# Patient Record
Sex: Male | Born: 1950 | Race: Black or African American | Hispanic: No | Marital: Married
Health system: Southern US, Community
[De-identification: ages and names within clinical notes are randomized; demographics above are authoritative.]

---

## 2020-07-03 ENCOUNTER — Other Ambulatory Visit: Payer: Self-pay

## 2020-07-03 ENCOUNTER — Inpatient Hospital Stay (HOSPITAL_COMMUNITY)
Admission: EM | Admit: 2020-07-03 | Discharge: 2020-08-07 | DRG: 870 | Disposition: E | Payer: No Typology Code available for payment source | Attending: Pulmonary Disease | Admitting: Pulmonary Disease

## 2020-07-03 ENCOUNTER — Emergency Department (HOSPITAL_COMMUNITY): Payer: No Typology Code available for payment source

## 2020-07-03 ENCOUNTER — Inpatient Hospital Stay (HOSPITAL_COMMUNITY): Payer: No Typology Code available for payment source

## 2020-07-03 ENCOUNTER — Encounter (HOSPITAL_COMMUNITY): Payer: Self-pay | Admitting: Emergency Medicine

## 2020-07-03 DIAGNOSIS — E872 Acidosis: Secondary | ICD-10-CM | POA: Diagnosis not present

## 2020-07-03 DIAGNOSIS — Z01818 Encounter for other preprocedural examination: Secondary | ICD-10-CM

## 2020-07-03 DIAGNOSIS — Z515 Encounter for palliative care: Secondary | ICD-10-CM | POA: Diagnosis not present

## 2020-07-03 DIAGNOSIS — I82442 Acute embolism and thrombosis of left tibial vein: Secondary | ICD-10-CM | POA: Diagnosis present

## 2020-07-03 DIAGNOSIS — R6520 Severe sepsis without septic shock: Secondary | ICD-10-CM | POA: Diagnosis not present

## 2020-07-03 DIAGNOSIS — D696 Thrombocytopenia, unspecified: Secondary | ICD-10-CM | POA: Diagnosis present

## 2020-07-03 DIAGNOSIS — I129 Hypertensive chronic kidney disease with stage 1 through stage 4 chronic kidney disease, or unspecified chronic kidney disease: Secondary | ICD-10-CM | POA: Diagnosis present

## 2020-07-03 DIAGNOSIS — N2581 Secondary hyperparathyroidism of renal origin: Secondary | ICD-10-CM | POA: Diagnosis present

## 2020-07-03 DIAGNOSIS — R6521 Severe sepsis with septic shock: Secondary | ICD-10-CM | POA: Diagnosis present

## 2020-07-03 DIAGNOSIS — J80 Acute respiratory distress syndrome: Secondary | ICD-10-CM | POA: Diagnosis not present

## 2020-07-03 DIAGNOSIS — E8770 Fluid overload, unspecified: Secondary | ICD-10-CM | POA: Diagnosis not present

## 2020-07-03 DIAGNOSIS — N179 Acute kidney failure, unspecified: Secondary | ICD-10-CM | POA: Diagnosis not present

## 2020-07-03 DIAGNOSIS — E1122 Type 2 diabetes mellitus with diabetic chronic kidney disease: Secondary | ICD-10-CM | POA: Diagnosis present

## 2020-07-03 DIAGNOSIS — E669 Obesity, unspecified: Secondary | ICD-10-CM | POA: Diagnosis present

## 2020-07-03 DIAGNOSIS — R0602 Shortness of breath: Secondary | ICD-10-CM

## 2020-07-03 DIAGNOSIS — G9340 Encephalopathy, unspecified: Secondary | ICD-10-CM | POA: Diagnosis not present

## 2020-07-03 DIAGNOSIS — I482 Chronic atrial fibrillation, unspecified: Secondary | ICD-10-CM | POA: Diagnosis not present

## 2020-07-03 DIAGNOSIS — E8809 Other disorders of plasma-protein metabolism, not elsewhere classified: Secondary | ICD-10-CM | POA: Diagnosis present

## 2020-07-03 DIAGNOSIS — N289 Disorder of kidney and ureter, unspecified: Secondary | ICD-10-CM | POA: Diagnosis not present

## 2020-07-03 DIAGNOSIS — J9611 Chronic respiratory failure with hypoxia: Secondary | ICD-10-CM

## 2020-07-03 DIAGNOSIS — A4189 Other specified sepsis: Secondary | ICD-10-CM | POA: Diagnosis present

## 2020-07-03 DIAGNOSIS — N183 Chronic kidney disease, stage 3 unspecified: Secondary | ICD-10-CM | POA: Diagnosis present

## 2020-07-03 DIAGNOSIS — R0902 Hypoxemia: Secondary | ICD-10-CM | POA: Diagnosis not present

## 2020-07-03 DIAGNOSIS — A419 Sepsis, unspecified organism: Secondary | ICD-10-CM | POA: Diagnosis present

## 2020-07-03 DIAGNOSIS — I4891 Unspecified atrial fibrillation: Secondary | ICD-10-CM | POA: Diagnosis present

## 2020-07-03 DIAGNOSIS — J069 Acute upper respiratory infection, unspecified: Secondary | ICD-10-CM

## 2020-07-03 DIAGNOSIS — R4182 Altered mental status, unspecified: Secondary | ICD-10-CM

## 2020-07-03 DIAGNOSIS — I82441 Acute embolism and thrombosis of right tibial vein: Secondary | ICD-10-CM | POA: Diagnosis present

## 2020-07-03 DIAGNOSIS — U071 COVID-19: Secondary | ICD-10-CM | POA: Diagnosis present

## 2020-07-03 DIAGNOSIS — I82431 Acute embolism and thrombosis of right popliteal vein: Secondary | ICD-10-CM | POA: Diagnosis not present

## 2020-07-03 DIAGNOSIS — F22 Delusional disorders: Secondary | ICD-10-CM | POA: Diagnosis not present

## 2020-07-03 DIAGNOSIS — I82451 Acute embolism and thrombosis of right peroneal vein: Secondary | ICD-10-CM | POA: Diagnosis not present

## 2020-07-03 DIAGNOSIS — R0989 Other specified symptoms and signs involving the circulatory and respiratory systems: Secondary | ICD-10-CM | POA: Diagnosis not present

## 2020-07-03 DIAGNOSIS — M109 Gout, unspecified: Secondary | ICD-10-CM | POA: Diagnosis present

## 2020-07-03 DIAGNOSIS — Z7189 Other specified counseling: Secondary | ICD-10-CM

## 2020-07-03 DIAGNOSIS — E875 Hyperkalemia: Secondary | ICD-10-CM | POA: Diagnosis not present

## 2020-07-03 DIAGNOSIS — I82432 Acute embolism and thrombosis of left popliteal vein: Secondary | ICD-10-CM | POA: Diagnosis present

## 2020-07-03 DIAGNOSIS — Z66 Do not resuscitate: Secondary | ICD-10-CM | POA: Diagnosis not present

## 2020-07-03 DIAGNOSIS — J9601 Acute respiratory failure with hypoxia: Secondary | ICD-10-CM | POA: Diagnosis not present

## 2020-07-03 DIAGNOSIS — Z781 Physical restraint status: Secondary | ICD-10-CM

## 2020-07-03 DIAGNOSIS — Z8673 Personal history of transient ischemic attack (TIA), and cerebral infarction without residual deficits: Secondary | ICD-10-CM

## 2020-07-03 DIAGNOSIS — R58 Hemorrhage, not elsewhere classified: Secondary | ICD-10-CM

## 2020-07-03 DIAGNOSIS — E1165 Type 2 diabetes mellitus with hyperglycemia: Secondary | ICD-10-CM | POA: Diagnosis present

## 2020-07-03 DIAGNOSIS — I248 Other forms of acute ischemic heart disease: Secondary | ICD-10-CM | POA: Diagnosis present

## 2020-07-03 DIAGNOSIS — Z9911 Dependence on respirator [ventilator] status: Secondary | ICD-10-CM

## 2020-07-03 DIAGNOSIS — E870 Hyperosmolality and hypernatremia: Secondary | ICD-10-CM | POA: Diagnosis not present

## 2020-07-03 DIAGNOSIS — I82452 Acute embolism and thrombosis of left peroneal vein: Secondary | ICD-10-CM | POA: Diagnosis present

## 2020-07-03 DIAGNOSIS — J1282 Pneumonia due to coronavirus disease 2019: Secondary | ICD-10-CM | POA: Diagnosis present

## 2020-07-03 DIAGNOSIS — G92 Toxic encephalopathy: Secondary | ICD-10-CM | POA: Diagnosis not present

## 2020-07-03 DIAGNOSIS — R7401 Elevation of levels of liver transaminase levels: Secondary | ICD-10-CM | POA: Diagnosis present

## 2020-07-03 DIAGNOSIS — L8990 Pressure ulcer of unspecified site, unspecified stage: Secondary | ICD-10-CM | POA: Insufficient documentation

## 2020-07-03 DIAGNOSIS — R069 Unspecified abnormalities of breathing: Secondary | ICD-10-CM

## 2020-07-03 DIAGNOSIS — J9600 Acute respiratory failure, unspecified whether with hypoxia or hypercapnia: Secondary | ICD-10-CM

## 2020-07-03 DIAGNOSIS — R4689 Other symptoms and signs involving appearance and behavior: Secondary | ICD-10-CM

## 2020-07-03 DIAGNOSIS — R680 Hypothermia, not associated with low environmental temperature: Secondary | ICD-10-CM | POA: Diagnosis not present

## 2020-07-03 DIAGNOSIS — E861 Hypovolemia: Secondary | ICD-10-CM | POA: Diagnosis present

## 2020-07-03 DIAGNOSIS — Z6836 Body mass index (BMI) 36.0-36.9, adult: Secondary | ICD-10-CM

## 2020-07-03 DIAGNOSIS — R7989 Other specified abnormal findings of blood chemistry: Secondary | ICD-10-CM | POA: Diagnosis not present

## 2020-07-03 LAB — PROTIME-INR
INR: 1 (ref 0.8–1.2)
Prothrombin Time: 13 seconds (ref 11.4–15.2)

## 2020-07-03 LAB — CBC WITH DIFFERENTIAL/PLATELET
Abs Immature Granulocytes: 0.05 10*3/uL (ref 0.00–0.07)
Basophils Absolute: 0 10*3/uL (ref 0.0–0.1)
Basophils Relative: 0 %
Eosinophils Absolute: 0 10*3/uL (ref 0.0–0.5)
Eosinophils Relative: 0 %
HCT: 34.8 % — ABNORMAL LOW (ref 39.0–52.0)
Hemoglobin: 11.3 g/dL — ABNORMAL LOW (ref 13.0–17.0)
Immature Granulocytes: 1 %
Lymphocytes Relative: 20 %
Lymphs Abs: 1 10*3/uL (ref 0.7–4.0)
MCH: 29 pg (ref 26.0–34.0)
MCHC: 32.5 g/dL (ref 30.0–36.0)
MCV: 89.2 fL (ref 80.0–100.0)
Monocytes Absolute: 0.5 10*3/uL (ref 0.1–1.0)
Monocytes Relative: 10 %
Neutro Abs: 3.4 10*3/uL (ref 1.7–7.7)
Neutrophils Relative %: 69 %
Platelets: 136 10*3/uL — ABNORMAL LOW (ref 150–400)
RBC: 3.9 MIL/uL — ABNORMAL LOW (ref 4.22–5.81)
RDW: 12.3 % (ref 11.5–15.5)
WBC: 4.9 10*3/uL (ref 4.0–10.5)
nRBC: 0 % (ref 0.0–0.2)

## 2020-07-03 LAB — I-STAT ARTERIAL BLOOD GAS, ED
Acid-base deficit: 5 mmol/L — ABNORMAL HIGH (ref 0.0–2.0)
Bicarbonate: 19.7 mmol/L — ABNORMAL LOW (ref 20.0–28.0)
Calcium, Ion: 1.14 mmol/L — ABNORMAL LOW (ref 1.15–1.40)
HCT: 35 % — ABNORMAL LOW (ref 39.0–52.0)
Hemoglobin: 11.9 g/dL — ABNORMAL LOW (ref 13.0–17.0)
O2 Saturation: 93 %
Potassium: 4.3 mmol/L (ref 3.5–5.1)
Sodium: 141 mmol/L (ref 135–145)
TCO2: 21 mmol/L — ABNORMAL LOW (ref 22–32)
pCO2 arterial: 33.7 mmHg (ref 32.0–48.0)
pH, Arterial: 7.375 (ref 7.350–7.450)
pO2, Arterial: 68 mmHg — ABNORMAL LOW (ref 83.0–108.0)

## 2020-07-03 LAB — COMPREHENSIVE METABOLIC PANEL
ALT: 86 U/L — ABNORMAL HIGH (ref 0–44)
AST: 205 U/L — ABNORMAL HIGH (ref 15–41)
Albumin: 2 g/dL — ABNORMAL LOW (ref 3.5–5.0)
Alkaline Phosphatase: 40 U/L (ref 38–126)
Anion gap: 11 (ref 5–15)
BUN: 51 mg/dL — ABNORMAL HIGH (ref 8–23)
CO2: 20 mmol/L — ABNORMAL LOW (ref 22–32)
Calcium: 8.2 mg/dL — ABNORMAL LOW (ref 8.9–10.3)
Chloride: 107 mmol/L (ref 98–111)
Creatinine, Ser: 3.81 mg/dL — ABNORMAL HIGH (ref 0.61–1.24)
GFR calc Af Amer: 18 mL/min — ABNORMAL LOW (ref >60)
GFR calc non Af Amer: 15 mL/min — ABNORMAL LOW (ref >60)
Glucose, Bld: 322 mg/dL — ABNORMAL HIGH (ref 70–99)
Potassium: 4.5 mmol/L (ref 3.5–5.1)
Sodium: 138 mmol/L (ref 135–145)
Total Bilirubin: 0.6 mg/dL (ref 0.3–1.2)
Total Protein: 6 g/dL — ABNORMAL LOW (ref 6.5–8.1)

## 2020-07-03 LAB — CK: Total CK: 876 U/L — ABNORMAL HIGH (ref 49–397)

## 2020-07-03 LAB — TROPONIN I (HIGH SENSITIVITY)
Troponin I (High Sensitivity): 61 ng/L — ABNORMAL HIGH (ref <18)
Troponin I (High Sensitivity): 64 ng/L — ABNORMAL HIGH

## 2020-07-03 LAB — URINALYSIS, ROUTINE W REFLEX MICROSCOPIC
Bilirubin Urine: NEGATIVE
Glucose, UA: 150 mg/dL — AB
Ketones, ur: NEGATIVE mg/dL
Leukocytes,Ua: NEGATIVE
Nitrite: NEGATIVE
Protein, ur: >=300 mg/dL — AB
Specific Gravity, Urine: 1.019 (ref 1.005–1.030)
pH: 5 (ref 5.0–8.0)

## 2020-07-03 LAB — SODIUM, URINE, RANDOM: Sodium, Ur: 13 mmol/L

## 2020-07-03 LAB — SARS CORONAVIRUS 2 BY RT PCR (HOSPITAL ORDER, PERFORMED IN ~~LOC~~ HOSPITAL LAB): SARS Coronavirus 2: POSITIVE — AB

## 2020-07-03 LAB — CBG MONITORING, ED
Glucose-Capillary: 305 mg/dL — ABNORMAL HIGH (ref 70–99)
Glucose-Capillary: 247 mg/dL — ABNORMAL HIGH (ref 70–99)
Glucose-Capillary: 217 mg/dL — ABNORMAL HIGH (ref 70–99)

## 2020-07-03 LAB — HEPATITIS B SURFACE ANTIGEN: Hepatitis B Surface Ag: NONREACTIVE

## 2020-07-03 LAB — FIBRINOGEN: Fibrinogen: 697 mg/dL — ABNORMAL HIGH (ref 210–475)

## 2020-07-03 LAB — HIV ANTIBODY (ROUTINE TESTING W REFLEX): HIV Screen 4th Generation wRfx: NONREACTIVE

## 2020-07-03 LAB — PROCALCITONIN: Procalcitonin: 1.79 ng/mL

## 2020-07-03 LAB — D-DIMER, QUANTITATIVE: D-Dimer, Quant: 11.87 ug/mL-FEU — ABNORMAL HIGH (ref 0.00–0.50)

## 2020-07-03 LAB — C-REACTIVE PROTEIN: CRP: 15.7 mg/dL — ABNORMAL HIGH

## 2020-07-03 LAB — APTT: aPTT: 38 seconds — ABNORMAL HIGH (ref 24–36)

## 2020-07-03 LAB — LACTIC ACID, PLASMA: Lactic Acid, Venous: 1.9 mmol/L (ref 0.5–1.9)

## 2020-07-03 LAB — BRAIN NATRIURETIC PEPTIDE: B Natriuretic Peptide: 78.5 pg/mL (ref 0.0–100.0)

## 2020-07-03 LAB — CREATININE, URINE, RANDOM: Creatinine, Urine: 203.27 mg/dL

## 2020-07-03 MED ORDER — SODIUM CHLORIDE 0.9 % IV BOLUS (SEPSIS)
250.0000 mL | Freq: Once | INTRAVENOUS | Status: AC
  Administered 2020-07-03: 250 mL via INTRAVENOUS

## 2020-07-03 MED ORDER — CHLORHEXIDINE GLUCONATE 0.12% ORAL RINSE (MEDLINE KIT)
15.0000 mL | Freq: Two times a day (BID) | OROMUCOSAL | Status: DC
  Administered 2020-07-04 – 2020-07-06 (×5): 15 mL via OROMUCOSAL

## 2020-07-03 MED ORDER — HEPARIN SODIUM (PORCINE) 5000 UNIT/ML IJ SOLN
5000.0000 [IU] | Freq: Three times a day (TID) | INTRAMUSCULAR | Status: DC
  Administered 2020-07-03 – 2020-07-06 (×8): 5000 [IU] via SUBCUTANEOUS
  Filled 2020-07-03 (×8): qty 1

## 2020-07-03 MED ORDER — INSULIN DETEMIR 100 UNIT/ML ~~LOC~~ SOLN
8.0000 [IU] | Freq: Two times a day (BID) | SUBCUTANEOUS | Status: DC
  Filled 2020-07-03: qty 0.08

## 2020-07-03 MED ORDER — SODIUM CHLORIDE 0.9 % IV SOLN
200.0000 mg | Freq: Once | INTRAVENOUS | Status: AC
  Administered 2020-07-03: 200 mg via INTRAVENOUS
  Filled 2020-07-03: qty 40

## 2020-07-03 MED ORDER — POLYETHYLENE GLYCOL 3350 17 G PO PACK: 17.0000 g | PACK | Freq: Every day | ORAL | Status: DC | PRN

## 2020-07-03 MED ORDER — SODIUM CHLORIDE 0.9 % IV SOLN
2.0000 g | Freq: Once | INTRAVENOUS | Status: AC
  Administered 2020-07-03: 2 g via INTRAVENOUS
  Filled 2020-07-03: qty 2

## 2020-07-03 MED ORDER — INSULIN DETEMIR 100 UNIT/ML ~~LOC~~ SOLN
8.0000 [IU] | Freq: Every day | SUBCUTANEOUS | Status: DC
  Administered 2020-07-03: 8 [IU] via SUBCUTANEOUS
  Filled 2020-07-03 (×2): qty 0.08

## 2020-07-03 MED ORDER — DOCUSATE SODIUM 100 MG PO CAPS
100.0000 mg | ORAL_CAPSULE | Freq: Two times a day (BID) | ORAL | Status: DC
  Administered 2020-07-04 – 2020-07-09 (×6): 100 mg via ORAL
  Filled 2020-07-03 (×10): qty 1

## 2020-07-03 MED ORDER — SODIUM CHLORIDE 0.9 % IV BOLUS (SEPSIS)
1000.0000 mL | Freq: Once | INTRAVENOUS | Status: AC
  Administered 2020-07-03: 1000 mL via INTRAVENOUS

## 2020-07-03 MED ORDER — SODIUM CHLORIDE 0.9 % IV SOLN: 250.0000 mL | INTRAVENOUS | Status: DC | PRN

## 2020-07-03 MED ORDER — SODIUM CHLORIDE 0.9% FLUSH
3.0000 mL | Freq: Two times a day (BID) | INTRAVENOUS | Status: DC
  Administered 2020-07-04: 3 mL via INTRAVENOUS

## 2020-07-03 MED ORDER — VANCOMYCIN HCL 2000 MG/400ML IV SOLN
2000.0000 mg | Freq: Once | INTRAVENOUS | Status: AC
  Administered 2020-07-03: 2000 mg via INTRAVENOUS
  Filled 2020-07-03: qty 400

## 2020-07-03 MED ORDER — SODIUM CHLORIDE 0.9% FLUSH: 3.0000 mL | INTRAVENOUS | Status: DC | PRN

## 2020-07-03 MED ORDER — ACETAMINOPHEN 325 MG PO TABS: 650.0000 mg | ORAL_TABLET | Freq: Four times a day (QID) | ORAL | Status: DC | PRN

## 2020-07-03 MED ORDER — METRONIDAZOLE IN NACL 5-0.79 MG/ML-% IV SOLN
500.0000 mg | Freq: Once | INTRAVENOUS | Status: AC
  Administered 2020-07-03: 500 mg via INTRAVENOUS
  Filled 2020-07-03: qty 100

## 2020-07-03 MED ORDER — INSULIN DETEMIR 100 UNIT/ML ~~LOC~~ SOLN: 8.0000 [IU] | Freq: Every day | SUBCUTANEOUS | Status: DC

## 2020-07-03 MED ORDER — ORAL CARE MOUTH RINSE
15.0000 mL | OROMUCOSAL | Status: DC
  Administered 2020-07-04 – 2020-07-06 (×17): 15 mL via OROMUCOSAL

## 2020-07-03 MED ORDER — VANCOMYCIN HCL IN DEXTROSE 1-5 GM/200ML-% IV SOLN: 1000.0000 mg | Freq: Once | INTRAVENOUS | Status: DC

## 2020-07-03 MED ORDER — SODIUM CHLORIDE 0.9% FLUSH
3.0000 mL | Freq: Two times a day (BID) | INTRAVENOUS | Status: DC
  Administered 2020-07-04 – 2020-07-16 (×12): 3 mL via INTRAVENOUS

## 2020-07-03 MED ORDER — SODIUM CHLORIDE 0.9 % IV SOLN
100.0000 mg | Freq: Every day | INTRAVENOUS | Status: AC
  Administered 2020-07-04 – 2020-07-08 (×4): 100 mg via INTRAVENOUS
  Filled 2020-07-03 (×8): qty 20

## 2020-07-03 MED ORDER — TOCILIZUMAB 400 MG/20ML IV SOLN
800.0000 mg | Freq: Once | INTRAVENOUS | Status: AC
  Administered 2020-07-03: 800 mg via INTRAVENOUS
  Filled 2020-07-03: qty 40

## 2020-07-03 MED ORDER — INSULIN DETEMIR 100 UNIT/ML ~~LOC~~ SOLN
8.0000 [IU] | Freq: Two times a day (BID) | SUBCUTANEOUS | Status: DC
  Filled 2020-07-03 (×2): qty 0.08

## 2020-07-03 MED ORDER — INSULIN ASPART 100 UNIT/ML ~~LOC~~ SOLN
0.0000 [IU] | SUBCUTANEOUS | Status: DC
  Administered 2020-07-03: 5 [IU] via SUBCUTANEOUS
  Administered 2020-07-03: 3 [IU] via SUBCUTANEOUS
  Administered 2020-07-04: 5 [IU] via SUBCUTANEOUS
  Administered 2020-07-04: 3 [IU] via SUBCUTANEOUS

## 2020-07-03 MED ORDER — DEXAMETHASONE SODIUM PHOSPHATE 10 MG/ML IJ SOLN
6.0000 mg | INTRAMUSCULAR | Status: DC
  Administered 2020-07-03 – 2020-07-05 (×3): 6 mg via INTRAVENOUS
  Filled 2020-07-03 (×3): qty 1

## 2020-07-03 NOTE — ED Triage Notes (Signed)
Pt's daughter from out of state called 911 to check on pt. EMS arrived pt was hypoxic sats in the 40s, pt was febrile, altered mental status. Pt incontinent of urine with foul smell. Pt lives with wife who recently had stroke so unable to get baseline or LSW.

## 2020-07-03 NOTE — H&P (Addendum)
NAME:  Gregory [name], MRN:  [id number], DOB:  06-13-1951, LOS: 0 ADMISSION DATE:  07/17/2020, CONSULTATION DATE:  07-17-2020 REFERRING MD:  ED provider, CHIEF COMPLAINT:  SOB, hypoxia  Brief History   69 yo BM presented to ED on 07-17-2020 with hypoxia on NRB to 15L and confusion, unable to give history. Code sepsis initiated, s/p 2.25L NS fluid resuscitation. Admitted to ICU.  History of present illness   Patient BIBA to ED after daughter (who lives out of state) called EMS because her father sounded confused on the phone. Per EDP's note, patient reportedly had SpO2 in 40s at home. He was placed on NRB and transported to [facility]. Here, patient is oriented to self only. He cannot answer questions about his medical history. In ED, patient on 15L NRB satting in upper 80s low 90s, still confused. Febrile to 101.8*F, tachypneic to 20s-30s. Arterial O2 low to 68, cr elevated to 3.81, elevated LFTs 205/86 AST/ALT, no leukocytosis or ANC elevation, procal elevated to 1.79.  Past Medical History  Unable to obtain due to confusion   Significant Hospital Events   Admitted to ICU 7/28  Consults:  PCCM  Procedures:    Significant Diagnostic Tests:  CXR diffuse bilateral airspace disease, pna vs edema, r/o COVID-19 SARS Coronavirus 2 pending  Micro Data:  Blood culture 7/28 >  Antimicrobials:  Vanc 7/28 >>> Flagyl 7/28>>> Cefepime 7/28>>> Interim history/subjective:  Patient alert, but not oriented, very confused.  Objective   Blood pressure (!) 154/75, pulse 73, temperature (!) 101.8 F (38.8 C), temperature source Oral, resp. rate (!) 31, SpO2 95 %.       No intake or output data in the 24 hours ending 07/17/2020 1543 There were no vitals filed for this visit.  Examination: General: older BM resting in bed, mild acute respiratory distress HENT: NRB in place Lungs: Crackles at base, diminished air flow diffusely, but present in all lung fields, no accessory muscle use Cardiovascular:  RRR, no m/r/g Abdomen: soft, NTND, normal bowel sounds present Extremities: warm, no edema Neuro: no focal deficits, alert and oriented to person only GU: not examined.  Resolved Hospital Problem list     Assessment & Plan:  Acute Hypoxic Respiratory Failure: PNA vs COVID-19 Patient is hypoxic, encephalopathic, febrile with CXR with PNA vs edema, high suspicion for COVID-19, results pending. Procal elevated to 1.79, continue abx pending COVID-19. - Admit to ICU, Dr. Tonia Brooms attending - Oxygen therapy: maintain sats >88% SpO2, currently on 15L LF NRB - Continuous cardiac and pulse ox monitoring - Obtain CRP, d-dimer, ferritin, fibrinogen now - AM labs: CBC, CMP, ABG, follow inflammatory markers - HS troponin mildly elevated to 62, awaiting repeat. EKG NSR, probable LAE. Will obtain echo. - Blood cx obtained, follow results - Vanc, Cefepime, Flagyl x1 in ED 7/27 - Heparin SQ for DVT ppx - Acetaminophen for fever - If COVID-19 is positive, recommend d/c abx and starting decadron 6mg  qd x 10d, actemra x1, remdesivir 200mg  load today followed by remdesivir 100 mg x 4d. -NPO due to confusion, bedside swallow. If passes, can have carb modified diet  Acute Renal Failure vs CKD Cr elevated to 3.81, unsure baseline as no medical records and confused. - avoid nephrotoxic medications - Obtain CK, urine electrolytes - UA, Urine cx collected and pending - Renal - AM CMP - s/p 2.25L NS resuscitation   Elevated Transaminases AST/ALT 205/68, unclear etiology but likely explained if patient has COVID-19. Will obtain hepatitis panel - Daily CMP -  Follow hepatitis panel  Hyperglycemia BG elevated to 322, unknown PMH. Trend CBGs to titrate insulin. Will attempt to find family member contact to obtain PMH. - Obtain hgb A1c - CBG q4hr - sens SSI - Levemir 8U qd  Best practice:  Diet: Carb modified Pain/Anxiety/Delirium protocol (if indicated): n/a VAP protocol (if indicated): n/a DVT  prophylaxis: heparin  GI prophylaxis: n/a Glucose control: SSI Mobility: bed rest Code Status: Full Family Communication: need to obtain family contact Disposition: ICU  Labs   CBC: Recent Labs  Lab 06/23/2020 1415 06/18/2020 1535  WBC 4.9  --   NEUTROABS 3.4  --   HGB 11.3* 11.9*  HCT 34.8* 35.0*  MCV 89.2  --   PLT 136*  --     Basic Metabolic Panel: Recent Labs  Lab 06/26/2020 1415 06/26/2020 1535  NA 138 141  K 4.5 4.3  CL 107  --   CO2 20*  --   GLUCOSE 322*  --   BUN 51*  --   CREATININE 3.81*  --   CALCIUM 8.2*  --    GFR: CrCl cannot be calculated (Unknown ideal weight.). Recent Labs  Lab 06/19/2020 1415  PROCALCITON 1.79  WBC 4.9  LATICACIDVEN 1.9    Liver Function Tests: Recent Labs  Lab 06/21/2020 1415  AST 205*  ALT 86*  ALKPHOS 40  BILITOT 0.6  PROT 6.0*  ALBUMIN 2.0*   No results for input(s): LIPASE, AMYLASE in the last 168 hours. No results for input(s): AMMONIA in the last 168 hours.  ABG    Component Value Date/Time   PHART 7.375 07/06/2020 1535   PCO2ART 33.7 06/30/2020 1535   PO2ART 68 (L) 06/19/2020 1535   HCO3 19.7 (L) 06/16/2020 1535   TCO2 21 (L) 06/11/2020 1535   ACIDBASEDEF 5.0 (H) 06/26/2020 1535   O2SAT 93.0 06/08/2020 1535     Coagulation Profile: Recent Labs  Lab 06/28/2020 1415  INR 1.0    Cardiac Enzymes: No results for input(s): CKTOTAL, CKMB, CKMBINDEX, TROPONINI in the last 168 hours.  HbA1C: No results found for: HGBA1C  CBG: No results for input(s): GLUCAP in the last 168 hours.  Review of Systems:   Unable to elicit  Past Medical History  He,  has no past medical history on file.   Surgical History    Unable to elicit Social History    Unable to elicit  Family History   His family history is not on file.   Allergies No Known Allergies   Home Medications  Prior to Admission medications   Not on File     Critical care time:      Shirlean Mylar, MD [facility]  Residency, PGY-2    PCCM:  69 yo M, febrile, septic, on 15L NRB, hypoxemic, bilateral infiltrates concerning for covid19. PCCM consulted.   BP (!) 148/66   Pulse 69   Temp (!) 102.2 F (39 C) (Oral)   Resp 21   SpO2 92%   Gen: obese male, resting in bed Neuro: alert to person but confused  Heart: RRR, s1 s2 Lungs: diminshed BL, no wheezing  Abd: soft, nt nd   Labs: reviewed  +covid biomarkers   A:  AHRF  BL infiltrates Concerning for COVID19. PCR pending  Elevated Ddimer  AKI  P: PCR covid pending  I have a high suspicion for covid Treatment started empirically  Due to confusion he needs to be in the ICU for close obs Probably better transitioned to  HHFNC If truly positive would given toci X1   This patient is critically ill with multiple organ system failure; which, requires frequent high complexity decision making, assessment, support, evaluation, and titration of therapies. This was completed through the application of advanced monitoring technologies and extensive interpretation of multiple databases. During this encounter critical care time was devoted to patient care services described in this note for 34 minutes.   Josephine Igo, DO [facility] 06/26/2020 7:55 PM

## 2020-07-03 NOTE — ED Notes (Signed)
Pt found w/ SpO2 at 72% w/ NRB on 10L, RN increased to 15L, SpO2 now between 88%-92%, admitting paged.

## 2020-07-03 NOTE — ED Provider Notes (Signed)
[facility] EMERGENCY DEPARTMENT Provider Note   CSN: [id number] Arrival date & time: 06/28/2020  1349     History Chief Complaint  Patient presents with  . Respiratory Distress    Gregory [name] is a 69 y.o. male w/ unclear PMHx presenting to the ED with hypoxia and confusion.  EMS called to the patient's house by his daughter who lives out of state, with concern for the patient behaving confused.  They found him febrile and hypoxic with RA sat reportedly in the 40's at home.  He was placed on NRB and brought to the ED.  Here the patient repeatedly tells me he feels "fine" but is confused and cannot provide additional history.  He cannot answer whether he received a covid vaccine.  He cannot tell me about his medical problems.  HPI     History reviewed. No pertinent past medical history.  Patient Active Problem List   Diagnosis Date Noted  . Sepsis ([place]) 06/28/2020      History reviewed. No pertinent family history.  Social History   Tobacco Use  . Smoking status: Not on file  Substance Use Topics  . Alcohol use: Not on file  . Drug use: Not on file    Home Medications Prior to Admission medications   Not on File    Allergies    Patient has no known allergies.  Review of Systems   Review of Systems  Unable to perform ROS: Mental status change (level 5 caveat)    Physical Exam Updated Vital Signs BP (!) 136/78 (BP Location: Right Arm)   Pulse 70   Temp (!) 102.2 F (39 C) (Oral)   Resp (!) 30   SpO2 95%   Physical Exam Vitals and nursing note reviewed.  Constitutional:      Appearance: He is well-developed.  HENT:     Head: Normocephalic and atraumatic.  Eyes:     Conjunctiva/sclera: Conjunctivae normal.  Cardiovascular:     Rate and Rhythm: Regular rhythm. Tachycardia present.     Pulses: Normal pulses.  Pulmonary:     Effort: Pulmonary effort is normal. No respiratory distress.     Breath sounds: Normal breath sounds.      Comments: Speaking in full sentences 91% on full NRB mask Diffuse coarse crackles on pulmonary exam Abdominal:     Palpations: Abdomen is soft.     Tenderness: There is no abdominal tenderness.  Musculoskeletal:     Cervical back: Neck supple.  Skin:    General: Skin is warm and dry.  Neurological:     Mental Status: He is alert.     Comments: Oriented only to self and place, does not know date or year Confused rambling responses     ED Results / Procedures / Treatments   Labs (all labs ordered are listed, but only abnormal results are displayed) Labs Reviewed  COMPREHENSIVE METABOLIC PANEL - Abnormal; Notable for the following components:      Result Value   CO2 20 (*)    Glucose, Bld 322 (*)    BUN 51 (*)    Creatinine, Ser 3.81 (*)    Calcium 8.2 (*)    Total Protein 6.0 (*)    Albumin 2.0 (*)    AST 205 (*)    ALT 86 (*)    GFR calc non Af Amer 15 (*)    GFR calc Af Amer 18 (*)    All other components within normal limits  CBC WITH  DIFFERENTIAL/PLATELET - Abnormal; Notable for the following components:   RBC 3.90 (*)    Hemoglobin 11.3 (*)    HCT 34.8 (*)    Platelets 136 (*)    All other components within normal limits  APTT - Abnormal; Notable for the following components:   aPTT 38 (*)    All other components within normal limits  URINALYSIS, ROUTINE W REFLEX MICROSCOPIC - Abnormal; Notable for the following components:   APPearance CLOUDY (*)    Glucose, UA 150 (*)    Hgb urine dipstick MODERATE (*)    Protein, ur >=300 (*)    Bacteria, UA RARE (*)    All other components within normal limits  I-STAT ARTERIAL BLOOD GAS, ED - Abnormal; Notable for the following components:   pO2, Arterial 68 (*)    Bicarbonate 19.7 (*)    TCO2 21 (*)    Acid-base deficit 5.0 (*)    Calcium, Ion 1.14 (*)    HCT 35.0 (*)    Hemoglobin 11.9 (*)    All other components within normal limits  CBG MONITORING, ED - Abnormal; Notable for the following components:    Glucose-Capillary 305 (*)    All other components within normal limits  TROPONIN I (HIGH SENSITIVITY) - Abnormal; Notable for the following components:   Troponin I (High Sensitivity) 61 (*)    All other components within normal limits  CULTURE, BLOOD (ROUTINE X 2)  CULTURE, BLOOD (ROUTINE X 2)  URINE CULTURE  SARS CORONAVIRUS 2 BY RT PCR (HOSPITAL ORDER, [facility])  LACTIC ACID, PLASMA  PROTIME-INR  PROCALCITONIN  BRAIN NATRIURETIC PEPTIDE  BLOOD GAS, ARTERIAL  HIV ANTIBODY (ROUTINE TESTING W REFLEX)  CBC  CREATININE, SERUM  C-REACTIVE PROTEIN  D-DIMER, QUANTITATIVE (NOT AT [facility])  FIBRINOGEN  CBC WITH DIFFERENTIAL/PLATELET  COMPREHENSIVE METABOLIC PANEL  C-REACTIVE PROTEIN  D-DIMER, QUANTITATIVE (NOT AT [facility])  FERRITIN  MAGNESIUM  PHOSPHORUS  CK  SODIUM, URINE, RANDOM  CREATININE, URINE, RANDOM  HCV AB W REFLEX TO QUANT PCR  HEPATITIS B SURFACE ANTIGEN  HEPATITIS B SURFACE ANTIBODY, QUANTITATIVE  TROPONIN I (HIGH SENSITIVITY)    EKG EKG Interpretation  Date/Time:  Wednesday July 03 2020 13:53:11 EDT Ventricular Rate:  74 PR Interval:    QRS Duration: 85 QT Interval:  351 QTC Calculation: 390 R Axis:   -5 Text Interpretation: Sinus rhythm Probable left atrial enlargement No STEMI Confirmed by Octaviano Glow [phone]) on 06/14/2020 2:12:55 PM   Radiology DG Chest Port 1 View  Result Date: 06/21/2020 CLINICAL DATA:  Sepsis, short of breath EXAM: PORTABLE CHEST 1 VIEW COMPARISON:  None. FINDINGS: Moderately severe diffuse bilateral airspace disease left greater than right. No pleural effusion. Heart size upper normal. IMPRESSION: Diffuse bilateral airspace disease. Possible pneumonia or edema. Rule out COVID. Electronically Signed   By: Franchot Gallo M.D.   On: 06/15/2020 14:40    Procedures .Critical Care Performed by: Wyvonnia Dusky, MD Authorized by: Wyvonnia Dusky, MD   Critical care provider statement:    Critical care  time (minutes):  45   Critical care was necessary to treat or prevent imminent or life-threatening deterioration of the following conditions:  Sepsis   Critical care was time spent personally by me on the following activities:  Discussions with consultants, evaluation of patient's response to treatment, examination of patient, ordering and performing treatments and interventions, ordering and review of laboratory studies, ordering and review of radiographic studies, pulse oximetry, re-evaluation of patient's condition,  obtaining history from patient or surrogate and review of old charts   (including critical care time)  Medications Ordered in ED Medications  vancomycin (VANCOREADY) IVPB 2000 mg/400 mL (2,000 mg Intravenous New Bag/Given 06/17/2020 1627)  heparin injection 5,000 Units (has no administration in time range)  sodium chloride flush (NS) 0.9 % injection 3 mL (has no administration in time range)  sodium chloride flush (NS) 0.9 % injection 3 mL (has no administration in time range)  sodium chloride flush (NS) 0.9 % injection 3 mL (has no administration in time range)  0.9 %  sodium chloride infusion (has no administration in time range)  chlorhexidine gluconate (MEDLINE KIT) (PERIDEX) 0.12 % solution 15 mL (has no administration in time range)  MEDLINE mouth rinse (has no administration in time range)  insulin aspart (novoLOG) injection 0-9 Units (has no administration in time range)  acetaminophen (TYLENOL) tablet 650 mg (has no administration in time range)  docusate sodium (COLACE) capsule 100 mg (has no administration in time range)  polyethylene glycol (MIRALAX / GLYCOLAX) packet 17 g (has no administration in time range)  insulin detemir (LEVEMIR) injection 8 Units (has no administration in time range)  sodium chloride 0.9 % bolus 1,000 mL (1,000 mLs Intravenous New Bag/Given 07/02/2020 1501)    And  sodium chloride 0.9 % bolus 1,000 mL (0 mLs Intravenous Stopped 07/06/2020 1530)     And  sodium chloride 0.9 % bolus 250 mL (250 mLs Intravenous New Bag/Given 07/05/2020 1627)  ceFEPIme (MAXIPIME) 2 g in sodium chloride 0.9 % 100 mL IVPB (0 g Intravenous Stopped 06/22/2020 1530)  metroNIDAZOLE (FLAGYL) IVPB 500 mg (0 mg Intravenous Stopped 06/18/2020 1601)    ED Course  I have reviewed the triage vital signs and the nursing notes.  Pertinent labs & imaging results that were available during my care of the patient were reviewed by me and considered in my medical decision making (see chart for details).  69 yo male presenting in respiratory distress, febrile, with some AMS reported by his daughter by phone when calling EMS.  No other family is here and no emergency contacts, or other medical information, was available in our system on his arrival.  Here he was febrile and hypoxic. On nonrebreather mask his SpO2 stabilized at 90-92%.  He was able to speak in full sentences to me and did not appear to be fatiguing with his breathing.  His RR was around 30.    A sepsis w/u was initiated.  With his hypoxia I had a strong suspicion for Covid or PNA as the source of his infection, but felt initiating broad spectrum empiric antibiotics was prudent for now.  He was ordered his 30 cc/kg fluid bolus.  He was placed on airborn precautions.  I did not feel he required emergent intubation on arrival, but needed close monitoring.  I personally reviewed his xray which was concerning for multifocal PNA vs severe viral illness.  His ECG was sinus rhythm but not acutely ischemic per my interpretation.  I reviewed several of his initial labs including lactate of 1.9, trop 61, CMP with elevated BUN and Cr (unclear baseline), BS 322, AST 205, ALT 86, Alb 2.0, WBC 4.9, Hgb 11.3.  Anion gap 11.  I felt that his confusion was most likely due to his hypoxia and infectious illness, less likely stroke.  With his fever, I was more suspicious of infection than PE.  I did consult critical care immediately as I felt  he was high risk  for requiring intubation.  Their team evaluated the patient and agreed with ICU admission.  At the time of my hand off, family had not yet been successfully located or contacted.  Clinical Course as of Jul 04 1727  Wed Jul 03, 2020  1456 No medical records available or patient information available at this time.  No family members present.   [MT]  3419 Patient 93% on NRB, RR 30, able to speak in complete sentences.  He does appear confused to answer some of my questions.   [MT]  1509 I spoke to dr [place] from critical care and requested a consult evaluation regarding the patient's hypoxia and confusion.  He remains stable now on NRB from a respiratory standpoint but may require close monitoring and intubation in the future if he continues to desaturate   [MT]  1611 Seen by ICU team who will admit to ICU   [MT]    Clinical Course User Index [MT] Rethel Sebek, Carola Rhine, MD    Final Clinical Impression(s) / ED Diagnoses Final diagnoses:  Shortness of breath  Acute renal failure ([place])  Hypoxia  Sepsis with acute renal failure without septic shock, due to unspecified organism, unspecified acute renal failure type [facility])  Encephalopathy    Rx / DC Orders ED Discharge Orders    None       Wyvonnia Dusky, MD 06/27/2020 1728

## 2020-07-03 NOTE — Progress Notes (Addendum)
eLink Physician-Brief Progress Note Patient Name: Gregory [name] DOB: [birth year] MRN: [id number]   Date of Service  Jul 13, 2020  HPI/Events of Note  Covid PNA. Received Covid protocol including Actemra. On nRB 10 lit sats dropped to 77, now on 15 lit with sats 90's. RR elevated. Hemodynamically stable. Discussed with bed side RN.   Labs reviewed. Troponin-demand ischemia. Flat rise Cr > 3. LA normal. Pro cal > 1. On abx.  7.37/33/68. earlier.  eICU Interventions  - trial of HFNC/optiflow  - prn ABG - if worsening need BiPAP with asp precautions. - encourage self proning      Intervention Category Intermediate Interventions: Respiratory distress - evaluation and management  Ranee Gosselin 2020-07-13, 11:12 PM

## 2020-07-03 NOTE — Sepsis Progress Note (Signed)
Notified bedside nurse of need to administer antibiotics.  

## 2020-07-04 ENCOUNTER — Inpatient Hospital Stay (HOSPITAL_COMMUNITY): Payer: No Typology Code available for payment source

## 2020-07-04 DIAGNOSIS — U071 COVID-19: Secondary | ICD-10-CM | POA: Diagnosis not present

## 2020-07-04 DIAGNOSIS — J9601 Acute respiratory failure with hypoxia: Secondary | ICD-10-CM | POA: Diagnosis not present

## 2020-07-04 LAB — BASIC METABOLIC PANEL
Anion gap: 11 (ref 5–15)
BUN: 61 mg/dL — ABNORMAL HIGH (ref 8–23)
CO2: 20 mmol/L — ABNORMAL LOW (ref 22–32)
Calcium: 8.1 mg/dL — ABNORMAL LOW (ref 8.9–10.3)
Chloride: 112 mmol/L — ABNORMAL HIGH (ref 98–111)
Creatinine, Ser: 3.44 mg/dL — ABNORMAL HIGH (ref 0.61–1.24)
GFR calc Af Amer: 20 mL/min — ABNORMAL LOW (ref >60)
GFR calc non Af Amer: 17 mL/min — ABNORMAL LOW (ref >60)
Glucose, Bld: 314 mg/dL — ABNORMAL HIGH (ref 70–99)
Potassium: 4.6 mmol/L (ref 3.5–5.1)
Sodium: 143 mmol/L (ref 135–145)

## 2020-07-04 LAB — URINE CULTURE: Culture: <10000 — AB

## 2020-07-04 LAB — FERRITIN: Ferritin: 4824 ng/mL — ABNORMAL HIGH (ref 24–336)

## 2020-07-04 LAB — CBC WITH DIFFERENTIAL/PLATELET
Abs Immature Granulocytes: 0 10*3/uL (ref 0.00–0.07)
Basophils Absolute: 0 10*3/uL (ref 0.0–0.1)
Basophils Relative: 0 %
Eosinophils Absolute: 0 10*3/uL (ref 0.0–0.5)
Eosinophils Relative: 0 %
HCT: 33.1 % — ABNORMAL LOW (ref 39.0–52.0)
Hemoglobin: 10.6 g/dL — ABNORMAL LOW (ref 13.0–17.0)
Lymphocytes Relative: 10 %
Lymphs Abs: 0.3 10*3/uL — ABNORMAL LOW (ref 0.7–4.0)
MCH: 29.2 pg (ref 26.0–34.0)
MCHC: 32 g/dL (ref 30.0–36.0)
MCV: 91.2 fL (ref 80.0–100.0)
Monocytes Absolute: 0 10*3/uL — ABNORMAL LOW (ref 0.1–1.0)
Monocytes Relative: 1 %
Neutro Abs: 2.9 10*3/uL (ref 1.7–7.7)
Neutrophils Relative %: 89 %
Platelets: 150 10*3/uL (ref 150–400)
RBC: 3.63 MIL/uL — ABNORMAL LOW (ref 4.22–5.81)
RDW: 12.4 % (ref 11.5–15.5)
WBC: 3.3 10*3/uL — ABNORMAL LOW (ref 4.0–10.5)
nRBC: 0 % (ref 0.0–0.2)
nRBC: 0 /100 WBC

## 2020-07-04 LAB — COMPREHENSIVE METABOLIC PANEL
ALT: 113 U/L — ABNORMAL HIGH (ref 0–44)
AST: 231 U/L — ABNORMAL HIGH (ref 15–41)
Albumin: 1.8 g/dL — ABNORMAL LOW (ref 3.5–5.0)
Alkaline Phosphatase: 40 U/L (ref 38–126)
Anion gap: 10 (ref 5–15)
BUN: 59 mg/dL — ABNORMAL HIGH (ref 8–23)
CO2: 20 mmol/L — ABNORMAL LOW (ref 22–32)
Calcium: 7.7 mg/dL — ABNORMAL LOW (ref 8.9–10.3)
Chloride: 114 mmol/L — ABNORMAL HIGH (ref 98–111)
Creatinine, Ser: 3.72 mg/dL — ABNORMAL HIGH (ref 0.61–1.24)
GFR calc Af Amer: 18 mL/min — ABNORMAL LOW (ref >60)
GFR calc non Af Amer: 16 mL/min — ABNORMAL LOW (ref >60)
Glucose, Bld: 317 mg/dL — ABNORMAL HIGH (ref 70–99)
Potassium: 5.2 mmol/L — ABNORMAL HIGH (ref 3.5–5.1)
Sodium: 144 mmol/L (ref 135–145)
Total Bilirubin: 0.6 mg/dL (ref 0.3–1.2)
Total Protein: 5.7 g/dL — ABNORMAL LOW (ref 6.5–8.1)

## 2020-07-04 LAB — PROTEIN / CREATININE RATIO, URINE
Creatinine, Urine: 130.29 mg/dL
Protein Creatinine Ratio: 2.98 mg/mg{Cre} — ABNORMAL HIGH (ref 0.00–0.15)
Total Protein, Urine: 388 mg/dL

## 2020-07-04 LAB — PHOSPHORUS: Phosphorus: 4.1 mg/dL (ref 2.5–4.6)

## 2020-07-04 LAB — GLUCOSE, CAPILLARY
Glucose-Capillary: 268 mg/dL — ABNORMAL HIGH (ref 70–99)
Glucose-Capillary: 267 mg/dL — ABNORMAL HIGH (ref 70–99)
Glucose-Capillary: 290 mg/dL — ABNORMAL HIGH (ref 70–99)
Glucose-Capillary: 290 mg/dL — ABNORMAL HIGH (ref 70–99)
Glucose-Capillary: 314 mg/dL — ABNORMAL HIGH (ref 70–99)
Glucose-Capillary: 276 mg/dL — ABNORMAL HIGH (ref 70–99)

## 2020-07-04 LAB — D-DIMER, QUANTITATIVE: D-Dimer, Quant: 5.06 ug/mL-FEU — ABNORMAL HIGH (ref 0.00–0.50)

## 2020-07-04 LAB — HCV INTERPRETATION

## 2020-07-04 LAB — ECHOCARDIOGRAM COMPLETE
Area-P 1/2: 4.49 cm2
Height: 67 in
S' Lateral: 3.33 cm
Weight: 3731.95 oz

## 2020-07-04 LAB — HEMOGLOBIN A1C
Hgb A1c MFr Bld: 11.3 % — ABNORMAL HIGH (ref 4.8–5.6)
Mean Plasma Glucose: 277.61 mg/dL

## 2020-07-04 LAB — CBG MONITORING, ED: Glucose-Capillary: 235 mg/dL — ABNORMAL HIGH (ref 70–99)

## 2020-07-04 LAB — C-REACTIVE PROTEIN: CRP: 14.6 mg/dL — ABNORMAL HIGH (ref <1.0)

## 2020-07-04 LAB — MAGNESIUM: Magnesium: 2.2 mg/dL (ref 1.7–2.4)

## 2020-07-04 LAB — HCV AB W REFLEX TO QUANT PCR: HCV Ab: <0.1 s/co ratio (ref 0.0–0.9)

## 2020-07-04 LAB — MRSA PCR SCREENING: MRSA by PCR: NEGATIVE

## 2020-07-04 LAB — HEPATITIS B SURFACE ANTIBODY, QUANTITATIVE: Hep B S AB Quant (Post): <3.1 m[IU]/mL — ABNORMAL LOW (ref >9.9)

## 2020-07-04 MED ORDER — INSULIN ASPART 100 UNIT/ML ~~LOC~~ SOLN
0.0000 [IU] | Freq: Three times a day (TID) | SUBCUTANEOUS | Status: DC
  Administered 2020-07-04 (×2): 5 [IU] via SUBCUTANEOUS

## 2020-07-04 MED ORDER — LACTATED RINGERS IV BOLUS
1000.0000 mL | Freq: Once | INTRAVENOUS | Status: AC
  Administered 2020-07-04: 1000 mL via INTRAVENOUS

## 2020-07-04 MED ORDER — SODIUM CHLORIDE 0.9 % IV SOLN: INTRAVENOUS | Status: DC

## 2020-07-04 MED ORDER — CALCIUM GLUCONATE-NACL 1-0.675 GM/50ML-% IV SOLN
1.0000 g | Freq: Once | INTRAVENOUS | Status: AC
  Administered 2020-07-04: 1000 mg via INTRAVENOUS
  Filled 2020-07-04: qty 50

## 2020-07-04 MED ORDER — SODIUM CHLORIDE 0.9% FLUSH
10.0000 mL | Freq: Two times a day (BID) | INTRAVENOUS | Status: DC
  Administered 2020-07-04 – 2020-07-06 (×5): 10 mL
  Administered 2020-07-07: 17 mL
  Administered 2020-07-08 – 2020-07-16 (×9): 10 mL

## 2020-07-04 MED ORDER — INSULIN ASPART 100 UNIT/ML ~~LOC~~ SOLN
0.0000 [IU] | SUBCUTANEOUS | Status: DC
  Administered 2020-07-04: 11 [IU] via SUBCUTANEOUS

## 2020-07-04 MED ORDER — INSULIN ASPART 100 UNIT/ML ~~LOC~~ SOLN
0.0000 [IU] | Freq: Every day | SUBCUTANEOUS | Status: DC
  Administered 2020-07-04: 4 [IU] via SUBCUTANEOUS

## 2020-07-04 MED ORDER — INSULIN DETEMIR 100 UNIT/ML ~~LOC~~ SOLN
12.0000 [IU] | Freq: Every day | SUBCUTANEOUS | Status: DC
  Filled 2020-07-04: qty 0.12

## 2020-07-04 MED ORDER — SODIUM CHLORIDE 0.9% FLUSH: 10.0000 mL | INTRAVENOUS | Status: DC | PRN

## 2020-07-04 MED ORDER — INSULIN DETEMIR 100 UNIT/ML ~~LOC~~ SOLN
12.0000 [IU] | Freq: Every day | SUBCUTANEOUS | Status: DC
  Administered 2020-07-04: 12 [IU] via SUBCUTANEOUS
  Filled 2020-07-04: qty 0.12

## 2020-07-04 MED ORDER — CHLORHEXIDINE GLUCONATE CLOTH 2 % EX PADS
6.0000 | MEDICATED_PAD | Freq: Every day | CUTANEOUS | Status: DC
  Administered 2020-07-04 – 2020-07-15 (×12): 6 via TOPICAL

## 2020-07-04 NOTE — Consult Note (Addendum)
Nephrology Consult   Requesting provider:  [facility] Service requesting consult: MICU Reason for consult: CKD and likely AKI   Assessment/Recommendations: Gregory [name] is a/an 69 y.o. male with a past medical history DM2, HTN, gout who present w/ acute hypoxic respiratory failure secondary to Covid  CKD with likely Non-Oliguric AKI: Based on renal ultrasound history the patient likely has some chronic kidney disease.  We do not have access to his outpatient records.  His CKD is most likely related to hypertension, arterionephrosclerosis, diabetic kidney disease (given uncontrolled diabetes as below).  Do not know his baseline but given signs of endorgan damage elsewhere (LFT abnormalities) he likely does have some acute kidney injury.  Also possibly had some renal injury from NSAID use.  Fortunately urine output is maintained at this time and he does not require dialysis -Chart reviewed: (medications acceptable, does not appear to have been exposed to nephrotoxins with imaging or had episodes of significant hypotension).   -Avoid excessive hydration given worsening pulmonary opacities and decreased renal function -Proteinuria on urinalysis, obtain UPC: Likely elevated due to diabetic kidney disease and AKI -Obtain PTH to aid in determining kidney disease chronicity -Continue to monitor daily Cr, Dose meds for GFR -Monitor Daily I/Os, Daily weight  -Maintain MAP>65 for optimal renal perfusion.  -avoid further nephrotoxins including NSAIDS and contrast if able -Avoid gadolinium if able -Currently no indication for HD but will monitor closely for indication  Volume Status: Appears volume overloaded on exam. Based on our examination and review of available imaging, our recommendation is remain judicious with fluids given decreased renal function.  IV diuretics can be used if the patient's hypoxia worsens.  Hypertension: Pressure only mildly elevated with systolics in the 833A.  Would hold  the patient's home Lasix, losartan, carvedilol.  Although would consider Lasix if the patient's volume status and hypoxia continues to worsen.  Hyperkalemia/metabolic acidosis: S5.0, CO2 20.  Based on potassium 5.2 we recommend continued monitoring.  Could consider therapy with Lokelma in the future if needed.  Based on bicarbonate 20 we will continue to monitor and consider bicarb if needed.  Hypoalbuminemia: Very low at 1.8.  Most likely low because of negative acute phase reactant in the setting of severe Covid infection as well as baseline diabetic kidney disease with urinary protein loss.  Rarely patients with severe Covid infection can develop severe nephrotic syndrome.  However, I think this is less likely.  Continue to monitor  Covid: On remdesivir and dexamethasone and requiring supplemental oxygen. S/p toci. Defer management to primary team  Anemia: Hemoglobin 10.6 possibly kidney disease is contributing but hard to say given we do not know his baseline.  No need for EPO at this time.  Continue to monitor.  Uncontrolled Diabetes Mellitus Type 2 with Hyperglycemia: Very poorly controlled with hemoglobin A1c of 11.3.  Defer management to primary team   Recommendations conveyed to primary service.    [facility] 07/04/2020 12:45 PM   _____________________________________________________________________________________ CC: CKD +/- AKI  History of Present Illness: Gregory [name] is a/an 69 y.o. male with a past medical history of DM2, HTN, gout, CVA, and asthma who presents with hypoxia and confusion.  On my assessments of most of the history was obtained per chart review and discussions with family members.  Patient was brought to the emergency department yesterday after his daughter called 911 because the patient sounded confused on the phone.  When EMS arrived the patient had oxygen saturations to 40%.  He was placed on nonrebreather and brought to  the emergency department.  On arrival he was oriented to self only and was unable to answer questions.  He was noted to be febrile, tachypneic, and with hypoxia on arterial blood gas.  Labs demonstrated a creatinine of 3.8 as well as AST/ALT of 205/86.  Patient had a chest x-ray which demonstrated bilateral opacities.  Covid returned positive.  Patient was admitted and remained on supplemental oxygen.  Over the past 24 hours he has become slightly less confused.  He has not been significantly hypotensive.  Chest x-ray today with worsening bilateral pulmonary opacities.  Further history was obtained from daughter who states that he recently moved here from [place].  He previously got his care at the [place].  She knows of his diabetes, hypertension, gout but is unsure if he has kidney disease.  I asked the patient today if he had any kidney disease and he states that he thinks he saw a kidney doctor in the past.  He is unable to provide any details.  He does state that he was taking Goody's powder frequently but once again difficult to tell if this information is accurate due to his altered mental status.  Renal ultrasound obtained demonstrates signs consistent with chronic kidney disease.  Creatinine has been stable since admission.  Urine output has been adequate.   Medications:  Current Facility-Administered Medications  Medication Dose Route Frequency Provider Last Rate Last Admin  . 0.9 %  sodium chloride infusion  250 mL Intravenous PRN Gladys Damme, MD      . acetaminophen (TYLENOL) tablet 650 mg  650 mg Oral Q6H PRN Gladys Damme, MD      . chlorhexidine gluconate (MEDLINE KIT) (PERIDEX) 0.12 % solution 15 mL  15 mL Mouth Rinse BID Gladys Damme, MD      . Chlorhexidine Gluconate Cloth 2 % PADS 6 each  6 each Topical Q0600 Icard, Bradley L, DO   6 each at 07/04/20 0407  . dexamethasone (DECADRON) injection 6 mg  6 mg Intravenous Q24H Icard, Bradley L, DO   6 mg at 06/30/2020 2037  .  docusate sodium (COLACE) capsule 100 mg  100 mg Oral BID Gladys Damme, MD   100 mg at 07/04/20 1017  . heparin injection 5,000 Units  5,000 Units Subcutaneous Q8H Gladys Damme, MD   5,000 Units at 07/04/20 6579  . insulin aspart (novoLOG) injection 0-5 Units  0-5 Units Subcutaneous QHS Gladys Damme, MD      . insulin aspart (novoLOG) injection 0-9 Units  0-9 Units Subcutaneous TID WC Gladys Damme, MD   5 Units at 07/04/20 1205  . insulin detemir (LEVEMIR) injection 12 Units  12 Units Subcutaneous Daily Gladys Damme, MD   12 Units at 07/04/20 [phone]  . MEDLINE mouth rinse  15 mL Mouth Rinse 10 times per day Gladys Damme, MD   15 mL at 07/04/20 1206  . polyethylene glycol (MIRALAX / GLYCOLAX) packet 17 g  17 g Oral Daily PRN Gladys Damme, MD      . remdesivir 100 mg in sodium chloride 0.9 % 100 mL IVPB  100 mg Intravenous Daily Icard, Bradley L, DO      . sodium chloride flush (NS) 0.9 % injection 3 mL  3 mL Intravenous Q12H Gladys Damme, MD      . sodium chloride flush (NS) 0.9 % injection 3 mL  3 mL Intravenous Q12H Gladys Damme, MD   3 mL at 07/04/20 1207  .  sodium chloride flush (NS) 0.9 % injection 3 mL  3 mL Intravenous PRN Gladys Damme, MD         ALLERGIES Patient has no known allergies.  MEDICAL HISTORY History reviewed. No pertinent past medical history.   SOCIAL HISTORY Social History   Socioeconomic History  . Marital status: Married    Spouse name: Not on file  . Number of children: Not on file  . Years of education: Not on file  . Highest education level: Not on file  Occupational History  . Not on file  Tobacco Use  . Smoking status: Not on file  Substance and Sexual Activity  . Alcohol use: Not on file  . Drug use: Not on file  . Sexual activity: Not on file  Other Topics Concern  . Not on file  Social History Narrative  . Not on file   Social Determinants of Health   Financial Resource Strain:   . Difficulty of Paying  Living Expenses:   Food Insecurity:   . Worried About Charity fundraiser in the Last Year:   . Arboriculturist in the Last Year:   Transportation Needs:   . Film/video editor (Medical):   Marland Kitchen Lack of Transportation (Non-Medical):   Physical Activity:   . Days of Exercise per Week:   . Minutes of Exercise per Session:   Stress:   . Feeling of Stress :   Social Connections:   . Frequency of Communication with Friends and Family:   . Frequency of Social Gatherings with Friends and Family:   . Attends Religious Services:   . Active Member of Clubs or Organizations:   . Attends Archivist Meetings:   Marland Kitchen Marital Status:   Intimate Partner Violence:   . Fear of Current or Ex-Partner:   . Emotionally Abused:   Marland Kitchen Physically Abused:   . Sexually Abused:      FAMILY HISTORY Unable to obtain family history due to the patient's altered mental status  Review of Systems: Unable to obtain complete review of systems due to the patient's altered mental status.  Physical Exam: Vitals:   07/04/20 1111 07/04/20 1200  BP:  (!) 143/78  Pulse:  50  Resp:  18  Temp: (!) 97.3 F (36.3 C)   SpO2:  97%   Total I/O In: 1.3 [IV Piggyback:1.3] Out: -   Intake/Output Summary (Last 24 hours) at 07/04/2020 1245 Last data filed at 07/04/2020 1000 Gross per 24 hour  Intake 2841.31 ml  Output 1450 ml  Net 1391.31 ml   General: Ill-appearing, mild distress HEENT: anicteric sclera, enlarged tongue dry lips CV: Bradycardia, no obvious murmurs, 1+ pitting edema in the bilateral lower extremities Lungs: Coarse bilateral breath sounds on nonrebreather, increased work of breathing Abd: soft, non-tender, mild distention Skin: no visible lesions or rashes Psych: Awake, alert, oriented to person and place but not time or situation Musculoskeletal: no obvious deformities Neuro: Delayed speech, confused thoughts, otherwise no obvious deficits  Test Results Reviewed Lab Results  Component  Value Date   NA 144 07/04/2020   K 5.2 (H) 07/04/2020   CL 114 (H) 07/04/2020   CO2 20 (L) 07/04/2020   BUN 59 (H) 07/04/2020   CREATININE 3.72 (H) 07/04/2020   CALCIUM 7.7 (L) 07/04/2020   ALBUMIN 1.8 (L) 07/04/2020   PHOS 4.1 07/04/2020

## 2020-07-04 NOTE — Evaluation (Signed)
Physical Therapy Evaluation Patient Details Name: Gregory [name] MRN: [id number] DOB: 1951/01/30 Today's Date: 07/04/2020   History of Present Illness  69 yo BM presented to ED on 06/09/2020 with hypoxia on NRB to 15L and confusion, unable to give history. Code sepsis initiated, s/p 2.25L NS fluid resuscitation. Admitted to ICU.Marland Kitchen  PMHx not available as of evaluation.  Clinical Impression  Pt admitted with/for AMS, hypoxia and sepsis.  Pt needing min guard to min assist and cues for direction to task..  Pt currently limited functionally due to the problems listed below.  (see problems list.)  Pt will benefit from PT to maximize function and safety to be able to get home safely with available assist.     Follow Up Recommendations Home health PT;Supervision/Assistance - 24 hour    Equipment Recommendations  None recommended by PT;Other (comment) (TBA)    Recommendations for Other Services       Precautions / Restrictions Precautions Precautions: Fall Restrictions Weight Bearing Restrictions: No      Mobility  Bed Mobility Overal bed mobility: Needs Assistance Bed Mobility: Supine to Sit;Sit to Supine     Supine to sit: Min guard Sit to supine: Min guard      Transfers Overall transfer level: Needs assistance   Transfers: Sit to/from Stand Sit to Stand: Min guard;From elevated surface            Ambulation/Gait Ambulation/Gait assistance: Min assist Gait Distance (Feet): 2 Feet Assistive device: IV Pole;None Gait Pattern/deviations: Step-to pattern;Step-through pattern     General Gait Details: marching in place with minimal stability assist  Stairs            Wheelchair Mobility    Modified Rankin (Stroke Patients Only)       Balance Overall balance assessment: Needs assistance Sitting-balance support: Single extremity supported;No upper extremity supported Sitting balance-Leahy Scale: Fair       Standing balance-Leahy Scale: Fair Standing  balance comment: mildly unsteady at EOB but able to stand statically at EOB without assist                             Pertinent Vitals/Pain Pain Assessment: No/denies pain    Home Living Family/patient expects to be discharged to:: Private residence Living Arrangements: Spouse/significant other Available Help at Discharge: Family;Available 24 hours/day (limited assist/s) Type of Home: Apartment Home Access: Level entry     Home Layout: One level Home Equipment: None      Prior Function Level of Independence: Independent               Hand Dominance        Extremity/Trunk Assessment        Lower Extremity Assessment Lower Extremity Assessment: Generalized weakness       Communication   Communication: No difficulties  Cognition Arousal/Alertness: Awake/alert Behavior During Therapy: WFL for tasks assessed/performed Overall Cognitive Status: No family/caregiver present to determine baseline cognitive functioning Area of Impairment: Orientation;Awareness;Problem solving                 Orientation Level: Situation;Time;Place         Awareness: Intellectual Problem Solving: Slow processing        General Comments General comments (skin integrity, edema, etc.): Sats on 13L NRB at 88-91%, HR during activity upper 80's to 90's    Exercises General Exercises - Lower Extremity Hip ABduction/ADduction: AROM;Strengthening;Both;10 reps;Standing Hip Flexion/Marching: AROM;Strengthening;Both;10 reps;Standing Mini-Sqauts: AROM;Strengthening;10 reps;Standing Other Exercises Other  Exercises: minimal stability assist while completing standing exercise.   Assessment/Plan    PT Assessment Patient needs continued PT services  PT Problem List Decreased strength;Decreased activity tolerance;Decreased balance;Decreased mobility;Cardiopulmonary status limiting activity;Decreased cognition       PT Treatment Interventions DME instruction;Gait  training;Functional mobility training;Therapeutic activities;Balance training;Patient/family education    PT Goals (Current goals can be found in the Care Plan section)  Acute Rehab PT Goals Patient Stated Goal: to be safe and independent to go home. PT Goal Formulation: Patient unable to participate in goal setting Time For Goal Achievement: 07/18/20 Potential to Achieve Goals: Good    Frequency Min 3X/week   Barriers to discharge        Co-evaluation               AM-PAC PT "6 Clicks" Mobility  Outcome Measure Help needed turning from your back to your side while in a flat bed without using bedrails?: A Little Help needed moving from lying on your back to sitting on the side of a flat bed without using bedrails?: A Little Help needed moving to and from a bed to a chair (including a wheelchair)?: A Little Help needed standing up from a chair using your arms (e.g., wheelchair or bedside chair)?: A Little Help needed to walk in hospital room?: A Little Help needed climbing 3-5 steps with a railing? : A Little 6 Click Score: 18    End of Session Equipment Utilized During Treatment: Oxygen Activity Tolerance: Patient tolerated treatment well;Patient limited by fatigue Patient left: in bed;with call bell/phone within reach;with bed alarm set Nurse Communication: Mobility status PT Visit Diagnosis: Unsteadiness on feet (R26.81);Other symptoms and signs involving the nervous system (R29.898);Difficulty in walking, not elsewhere classified (R26.2)    Time: 0932-6712 PT Time Calculation (min) (ACUTE ONLY): 29 min   Charges:   PT Evaluation $PT Eval Moderate Complexity: 1 Mod PT Treatments $Therapeutic Activity: 8-22 mins        07/04/2020  Jacinto Halim., PT Acute Rehabilitation Services [phone]  (pager) [phone]  (office)  Eliseo Gum Shellye Zandi 07/04/2020, 6:16 PM

## 2020-07-04 NOTE — TOC Initial Note (Signed)
Transition of Care [facility]) - Initial/Assessment Note    Patient Details  Name: Gregory [name] MRN: [id number] Date of Birth: November 06, 1951  [facility] CM/SW Contact:    Bess Kinds, RN Phone Number: [phone] 07/04/2020, 5:12 PM  Clinical Narrative:                  Spoke with Gregory [name] to advise of patient admission through ED. Authorization #[id number].   Spoke with patient's daughter, Gregory [name], who is from [place], however, currently in [place] to look after her mom. Gregory [name] states that patient and spouse moved to [place] from [place] about 3 weeks ago. Patient followed by [facility] in [place]. Gregory [name] is unsure if patient has enrolled in [facility], but will call tomorrow to find out. Gregory [name] would like patient to have home care services through [facility] at discharge. Advised of following for transition needs for [facility] and home care vs potential rehab. Gregory [name] verbalized understanding. TOC following for transition needs.   Expected Discharge Plan: Home w Home Health Services Barriers to Discharge: Continued Medical Work up   Patient Goals and CMS Choice        Expected Discharge Plan and Services Expected Discharge Plan: Home w Home Health Services   Discharge Planning Services: CM Consult   Living arrangements for the past 2 months: Single Family Home                                      Prior Living Arrangements/Services Living arrangements for the past 2 months: Single Family Home Lives with:: Self, Spouse Patient language and need for interpreter reviewed:: Yes        Need for Family Participation in Patient Care: Yes (Comment) Care giver support system in place?: Yes (comment)   Criminal Activity/Legal Involvement Pertinent to Current Situation/Hospitalization: No - Comment as needed  Activities of Daily Living   ADL Screening (condition at time of admission) Patient's cognitive ability adequate to safely complete daily  activities?: Yes Is the patient deaf or have difficulty hearing?: No Does the patient have difficulty seeing, even when wearing glasses/contacts?: No Does the patient have difficulty concentrating, remembering, or making decisions?: No Patient able to express need for assistance with ADLs?: Yes Does the patient have difficulty dressing or bathing?: Yes Independently performs ADLs?: No Communication: Independent Dressing (OT): Needs assistance Is this a change from baseline?: Pre-admission baseline Grooming: Needs assistance Is this a change from baseline?: Pre-admission baseline Feeding: Needs assistance Is this a change from baseline?: Pre-admission baseline Bathing: Needs assistance Is this a change from baseline?: Pre-admission baseline Toileting: Needs assistance Is this a change from baseline?: Pre-admission baseline In/Out Bed: Needs assistance Is this a change from baseline?: Pre-admission baseline Does the patient have difficulty walking or climbing stairs?: Yes Weakness of Legs: Both Weakness of Arms/Hands: Both  Permission Sought/Granted                  Emotional Assessment         Alcohol / Substance Use: Not Applicable Psych Involvement: No (comment)  Admission diagnosis:  Shortness of breath [R06.02] Acute renal failure (HCC) [N17.9] Encephalopathy [G93.40] Hypoxia [R09.02] Sepsis (HCC) [A41.9] Sepsis with acute renal failure without septic shock, due to unspecified organism, unspecified acute renal failure type (HCC) [A41.9, R65.20, N17.9] Patient Active Problem List   Diagnosis Date Noted  . Sepsis (HCC) 06/20/2020   PCP:  [place] [place]  [facility]:   [facility] [place], [place] - [address] [place] [address] Phone: [phone] Fax: [phone]     Social Determinants of Health (SDOH) Interventions    Readmission Risk Interventions No flowsheet data found.

## 2020-07-04 NOTE — Progress Notes (Signed)
Called patient's daughter, Ojas Coone, [phone]. Patient has PMH of DMT2, HTN, gout, remote h/o CVA (no known residual deficits), and asthma. He just moved to [place] from [place], [place], 3 weeks ago, where he was a patient at the [facility]. Daughter is not sure if he has kidney disease. She notes that he was not vaccinated. 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 Daughter lives in [place] and is trying to get more home help for her father.  Shirlean Mylar, MD [facility], PGY-2

## 2020-07-04 NOTE — ED Notes (Signed)
Pt resting comfortably in bed with eyes closed, pt easily arousalable to verbal and tactile stimuli.

## 2020-07-04 NOTE — ED Notes (Signed)
Left contact number with [phone] for pt report.

## 2020-07-04 NOTE — Progress Notes (Signed)
Pt taken off NRB and placed on 13L HFNC. Pt in no distress, no increased WOB, VS within normal limits. RT will continue to monitor

## 2020-07-04 NOTE — Progress Notes (Signed)
ELink attempted to connect pt to daughter via camera. No response from daughter to the texted link sent. If family would like to try a call again, we are happy to help them

## 2020-07-04 NOTE — Progress Notes (Signed)
  Placed pt on heated HFNC due to low spo2. Pt on 35L/100% with spo2 improving to 91%. RN aware

## 2020-07-04 NOTE — Progress Notes (Signed)
Patient has  Two tone watch silver neckless gold ring with diamonds and silver band. All place in bag at bedside with patient label

## 2020-07-04 NOTE — H&P (Addendum)
NAME:  Fateh Kindle, MRN:  [id number], DOB:  02/22/1951, LOS: 1 ADMISSION DATE:  2020/07/25, CONSULTATION DATE:  07/25/2020 REFERRING MD:  ED provider, CHIEF COMPLAINT:  SOB, hypoxia  Brief History   69 yo BM presented to ED on July 25, 2020 with hypoxia on NRB to 15L and confusion, unable to give history. Code sepsis initiated, s/p 2.25L NS fluid resuscitation. Admitted to ICU.  History of present illness   Patient BIBA to ED after daughter (who lives out of state) called EMS because her father sounded confused on the phone. Per EDP's note, patient reportedly had SpO2 in 40s at home. He was placed on NRB and transported to [facility]. Here, patient is oriented to self only. He cannot answer questions about his medical history. In ED, patient on 15L NRB satting in upper 80s low 90s, still confused. Febrile to 101.8*F, tachypneic to 20s-30s. Arterial O2 low to 68, cr elevated to 3.81, elevated LFTs 205/86 AST/ALT, no leukocytosis or ANC elevation, procal elevated to 1.79.  Past Medical History  Unable to obtain due to confusion   Significant Hospital Events   Admitted to ICU 7/28  Consults:  PCCM  Procedures:    Significant Diagnostic Tests:  CXR diffuse bilateral airspace disease, pna vs edema, r/o COVID-19 SARS Coronavirus 2 pending  Micro Data:  Blood culture 7/28 > Urine cx 7/28 >  Antimicrobials:  Vanc 7/28  Flagyl 7/28 Cefepime 7/28 Remdesivir 7/28 x 5d Interim history/subjective:  COVID-19 confirmed positive. No acute events overnight. Satting well on 15L NRB.  Objective   Blood pressure (!) 131/80, pulse 56, temperature 98.1 F (36.7 C), temperature source Axillary, resp. rate (!) 26, height 5\' 7"  (1.702 m), weight (!) 105.8 kg, SpO2 98 %.        Intake/Output Summary (Last 24 hours) at 07/04/2020 07/06/2020 Last data filed at 07/04/2020 0600 Gross per 24 hour  Intake 2840 ml  Output 1450 ml  Net 1390 ml   Filed Weights   07/04/20 0410  Weight: (!) 105.8 kg     Examination: General: older BM resting in bed, NARD HENT: NRB in place Lungs: Crackles at base, diminished air flow diffusely, but present in all lung fields, no accessory muscle use Cardiovascular: RRR, no m/r/g Abdomen: soft, NTND, normal bowel sounds present Extremities: warm, no edema Neuro: no focal deficits, alert and oriented to person and place GU: not examined.  Resolved Hospital Problem list     Assessment & Plan:  Acute Hypoxic Respiratory Failure d/t COVID-19 - Oxygen therapy: maintain sats >88% SpO2, currently on 15L NRB - Continuous cardiac and pulse ox monitoring - AM labs: CBC, CMP, ABG, follow inflammatory markers - Elevated troponin trended flat, likely due to demand ischemia in setting of respiratory distress - Blood cx and urine cx obtained, follow results - s/p Vanc, Cefepime, Flagyl x1 in ED 7/27 - Heparin SQ for DVT ppx - Acetaminophen for fever - s/p Actemra x1 7/28 - Decadron 6mg  qd x10d (7/28-8/6) - Remdesivir x 5d (7/28-8/1)  Acute Renal Failure vs CKD Cr elevated to 3.81 at admission, 3.71 today. Unsure baseline as no medical records and confused.  - Will consult nephrology, appreciate recommendations - avoid nephrotoxic medications - FeNa 0.2% indicating pre-renal etiology, however, patient does not appear volume down - Urine cx collected and pending - Renal 02-09-1995 with medical-renal disease, no other abnormalitiy - AM CMP - Will give 1L LR bolus, avoid over resuscitation d/t concern for developing pulmonary edema, echo pending - s/p 2.25L NS resuscitation  Elevated Transaminases AST/ALT worsened to 231/113,  likely explained by COVID-19. Will obtain hepatitis panel. Will continue to monitor as remdesivir started yesterday. - Daily CMP - Follow hepatitis panel  Hyperglycemia BG elevated 322-217, unknown PMH. Trend CBGs to titrate insulin. Will attempt to find family member contact to obtain PMH. - Obtain hgb A1c - CBG TID AC, qHS - sens  SSI - Levemir 12U qd  Best practice:  Diet: Carb modified Pain/Anxiety/Delirium protocol (if indicated): n/a VAP protocol (if indicated): n/a DVT prophylaxis: heparin  GI prophylaxis: n/a Glucose control: SSI Mobility: bed rest Code Status: Full Family Communication: need to obtain family contact Disposition: ICU  Labs   CBC: Recent Labs  Lab 06/16/2020 1415 06/09/2020 1535 07/04/20 0720  WBC 4.9  --  3.3*  NEUTROABS 3.4  --  PENDING  HGB 11.3* 11.9* 10.6*  HCT 34.8* 35.0* 33.1*  MCV 89.2  --  91.2  PLT 136*  --  150    Basic Metabolic Panel: Recent Labs  Lab 07/05/2020 1415 07/05/2020 1535  NA 138 141  K 4.5 4.3  CL 107  --   CO2 20*  --   GLUCOSE 322*  --   BUN 51*  --   CREATININE 3.81*  --   CALCIUM 8.2*  --    GFR: Estimated Creatinine Clearance: 21.8 mL/min (A) (by C-G formula based on SCr of 3.81 mg/dL (H)). Recent Labs  Lab 06/06/2020 1415 07/04/20 0720  PROCALCITON 1.79  --   WBC 4.9 3.3*  LATICACIDVEN 1.9  --     Liver Function Tests: Recent Labs  Lab 06/18/2020 1415  AST 205*  ALT 86*  ALKPHOS 40  BILITOT 0.6  PROT 6.0*  ALBUMIN 2.0*   No results for input(s): LIPASE, AMYLASE in the last 168 hours. No results for input(s): AMMONIA in the last 168 hours.  ABG    Component Value Date/Time   PHART 7.375 07/05/2020 1535   PCO2ART 33.7 06/22/2020 1535   PO2ART 68 (L) 06/28/2020 1535   HCO3 19.7 (L) 06/26/2020 1535   TCO2 21 (L) 06/14/2020 1535   ACIDBASEDEF 5.0 (H) 07/06/2020 1535   O2SAT 93.0 06/23/2020 1535     Coagulation Profile: Recent Labs  Lab 06/20/2020 1415  INR 1.0    Cardiac Enzymes: Recent Labs  Lab 06/10/2020 1830  CKTOTAL 876*    HbA1C: No results found for: HGBA1C  CBG: Recent Labs  Lab 06/14/2020 1818 06/18/2020 1957 07/04/20 0016 07/04/20 0411 07/04/20 0720  GLUCAP 247* 217* 235* 268* 267*    Review of Systems:   Unable to elicit  Past Medical History  He,  has no past medical history on file.    Surgical History    Unable to elicit Social History    Unable to elicit  Family History   His family history is not on file.   Allergies No Known Allergies   Home Medications  Prior to Admission medications   Not on File     Critical care time:      Shirlean Mylar, MD [facility], PGY-2    PCCM:  69 yo covid 19 PNA, ARF, hyperkalemia   BP (!) 143/78   Pulse 50   Temp (!) 97.3 F (36.3 C) (Axillary)   Resp 18   Ht 5\' 7"  (1.702 m)   Wt (!) 105.8 kg   SpO2 97%   BMI 36.53 kg/m   Gen: obese aam, on nrb, less confused  Neuro: aaox2, person and  place  Heart: rrr, s1 s2 Lungs: crackles in bases, no wheeze  Abd: soft   Labs: reviewed  Scr worse Elevated K   A: COVID19 PNA  Likely AKI on CKD  HTN  Hyperkalemia  P: rem + dec + toci X1  Follow covid bio markers  Consult nephrology  We appreciate their input  No urgent indication for rrt at this time  Would like to come off NRB and move to salter catheter today  PT eval  Up in chair  Close obs in the icu, high risk for intubation   This patient is critically ill with multiple organ system failure; which, requires frequent high complexity decision making, assessment, support, evaluation, and titration of therapies. This was completed through the application of advanced monitoring technologies and extensive interpretation of multiple databases. During this encounter critical care time was devoted to patient care services described in this note for 32 minutes.  Josephine Igo, DO [facility] 07/04/2020 1:23 PM

## 2020-07-04 NOTE — Progress Notes (Signed)
  Echocardiogram 2D Echocardiogram has been performed.  Gregory [name] 07/04/2020, 9:47 AM

## 2020-07-05 DIAGNOSIS — R0902 Hypoxemia: Secondary | ICD-10-CM

## 2020-07-05 DIAGNOSIS — R0602 Shortness of breath: Secondary | ICD-10-CM

## 2020-07-05 LAB — PTH, INTACT AND CALCIUM
Calcium, Total (PTH): 7.6 mg/dL — ABNORMAL LOW (ref 8.6–10.2)
PTH: 80 pg/mL — ABNORMAL HIGH (ref 15–65)

## 2020-07-05 LAB — CBC WITH DIFFERENTIAL/PLATELET
Abs Immature Granulocytes: 0.07 10*3/uL (ref 0.00–0.07)
Basophils Absolute: 0 10*3/uL (ref 0.0–0.1)
Basophils Relative: 0 %
Eosinophils Absolute: 0 10*3/uL (ref 0.0–0.5)
Eosinophils Relative: 0 %
HCT: 34.4 % — ABNORMAL LOW (ref 39.0–52.0)
Hemoglobin: 11 g/dL — ABNORMAL LOW (ref 13.0–17.0)
Immature Granulocytes: 1 %
Lymphocytes Relative: 13 %
Lymphs Abs: 0.9 10*3/uL (ref 0.7–4.0)
MCH: 28.7 pg (ref 26.0–34.0)
MCHC: 32 g/dL (ref 30.0–36.0)
MCV: 89.8 fL (ref 80.0–100.0)
Monocytes Absolute: 0.5 10*3/uL (ref 0.1–1.0)
Monocytes Relative: 7 %
Neutro Abs: 5.6 10*3/uL (ref 1.7–7.7)
Neutrophils Relative %: 79 %
Platelets: 189 10*3/uL (ref 150–400)
RBC: 3.83 MIL/uL — ABNORMAL LOW (ref 4.22–5.81)
RDW: 12.5 % (ref 11.5–15.5)
WBC: 7.1 10*3/uL (ref 4.0–10.5)
nRBC: 0 % (ref 0.0–0.2)

## 2020-07-05 LAB — COMPREHENSIVE METABOLIC PANEL
ALT: 86 U/L — ABNORMAL HIGH (ref 0–44)
AST: 101 U/L — ABNORMAL HIGH (ref 15–41)
Albumin: 1.8 g/dL — ABNORMAL LOW (ref 3.5–5.0)
Alkaline Phosphatase: 50 U/L (ref 38–126)
Anion gap: 11 (ref 5–15)
BUN: 62 mg/dL — ABNORMAL HIGH (ref 8–23)
CO2: 19 mmol/L — ABNORMAL LOW (ref 22–32)
Calcium: 8 mg/dL — ABNORMAL LOW (ref 8.9–10.3)
Chloride: 112 mmol/L — ABNORMAL HIGH (ref 98–111)
Creatinine, Ser: 2.99 mg/dL — ABNORMAL HIGH (ref 0.61–1.24)
GFR calc Af Amer: 24 mL/min — ABNORMAL LOW (ref >60)
GFR calc non Af Amer: 21 mL/min — ABNORMAL LOW (ref >60)
Glucose, Bld: 337 mg/dL — ABNORMAL HIGH (ref 70–99)
Potassium: 5.4 mmol/L — ABNORMAL HIGH (ref 3.5–5.1)
Sodium: 142 mmol/L (ref 135–145)
Total Bilirubin: 0.4 mg/dL (ref 0.3–1.2)
Total Protein: 5.6 g/dL — ABNORMAL LOW (ref 6.5–8.1)

## 2020-07-05 LAB — BASIC METABOLIC PANEL
Anion gap: 11 (ref 5–15)
BUN: 66 mg/dL — ABNORMAL HIGH (ref 8–23)
CO2: 18 mmol/L — ABNORMAL LOW (ref 22–32)
Calcium: 8 mg/dL — ABNORMAL LOW (ref 8.9–10.3)
Chloride: 112 mmol/L — ABNORMAL HIGH (ref 98–111)
Creatinine, Ser: 3.17 mg/dL — ABNORMAL HIGH (ref 0.61–1.24)
GFR calc Af Amer: 22 mL/min — ABNORMAL LOW (ref >60)
GFR calc non Af Amer: 19 mL/min — ABNORMAL LOW (ref >60)
Glucose, Bld: 365 mg/dL — ABNORMAL HIGH (ref 70–99)
Potassium: 4.9 mmol/L (ref 3.5–5.1)
Sodium: 141 mmol/L (ref 135–145)

## 2020-07-05 LAB — GLUCOSE, CAPILLARY
Glucose-Capillary: 287 mg/dL — ABNORMAL HIGH (ref 70–99)
Glucose-Capillary: 319 mg/dL — ABNORMAL HIGH (ref 70–99)
Glucose-Capillary: 328 mg/dL — ABNORMAL HIGH (ref 70–99)
Glucose-Capillary: 354 mg/dL — ABNORMAL HIGH (ref 70–99)
Glucose-Capillary: 321 mg/dL — ABNORMAL HIGH (ref 70–99)
Glucose-Capillary: 246 mg/dL — ABNORMAL HIGH (ref 70–99)

## 2020-07-05 LAB — C-REACTIVE PROTEIN: CRP: 9.5 mg/dL — ABNORMAL HIGH (ref <1.0)

## 2020-07-05 LAB — FERRITIN: Ferritin: 5962 ng/mL — ABNORMAL HIGH (ref 24–336)

## 2020-07-05 LAB — PHOSPHORUS: Phosphorus: 4.1 mg/dL (ref 2.5–4.6)

## 2020-07-05 LAB — MAGNESIUM: Magnesium: 2.4 mg/dL (ref 1.7–2.4)

## 2020-07-05 LAB — D-DIMER, QUANTITATIVE: D-Dimer, Quant: 6.5 ug/mL-FEU — ABNORMAL HIGH (ref 0.00–0.50)

## 2020-07-05 MED ORDER — INSULIN ASPART 100 UNIT/ML ~~LOC~~ SOLN
0.0000 [IU] | Freq: Three times a day (TID) | SUBCUTANEOUS | Status: DC
  Administered 2020-07-05 (×2): 11 [IU] via SUBCUTANEOUS
  Administered 2020-07-05: 15 [IU] via SUBCUTANEOUS
  Administered 2020-07-06: 8 [IU] via SUBCUTANEOUS
  Administered 2020-07-06 (×2): 11 [IU] via SUBCUTANEOUS

## 2020-07-05 MED ORDER — INSULIN ASPART 100 UNIT/ML ~~LOC~~ SOLN
0.0000 [IU] | Freq: Every day | SUBCUTANEOUS | Status: DC
  Administered 2020-07-05: 4 [IU] via SUBCUTANEOUS
  Administered 2020-07-06: 3 [IU] via SUBCUTANEOUS

## 2020-07-05 MED ORDER — FUROSEMIDE 10 MG/ML IJ SOLN
40.0000 mg | Freq: Once | INTRAMUSCULAR | Status: AC
  Administered 2020-07-05: 40 mg via INTRAVENOUS
  Filled 2020-07-05: qty 4

## 2020-07-05 MED ORDER — INSULIN DETEMIR 100 UNIT/ML ~~LOC~~ SOLN
20.0000 [IU] | Freq: Every day | SUBCUTANEOUS | Status: DC
  Administered 2020-07-05: 20 [IU] via SUBCUTANEOUS
  Filled 2020-07-05 (×2): qty 0.2

## 2020-07-05 NOTE — Progress Notes (Signed)
Admit: 06/24/2020 LOS: 2  30M with COVID19 PNA  Subjective:   More awake and alert  [name]   Decadron, Remdesivir, Actrema  SCr downtrendign, UOP > 1L  Not sure what his baseilne is  Renal US was mild inc echogenicity and no HN/obstruction  07/29 0701 - 07/30 0700 In: 841.3 [P.O.:740; IV Piggyback:101.3] Out: 1050 [Urine:1050]  Filed Weights   07/04/20 0410 07/05/20 0500  Weight: (!) 105.8 kg (!) 105.3 kg    Scheduled Meds:  chlorhexidine gluconate (MEDLINE KIT)  15 mL Mouth Rinse BID   Chlorhexidine Gluconate Cloth  6 each Topical Q0600   dexamethasone (DECADRON) injection  6 mg Intravenous Q24H   docusate sodium  100 mg Oral BID   furosemide  40 mg Intravenous Once   heparin  5,000 Units Subcutaneous Q8H   insulin aspart  0-15 Units Subcutaneous TID WC   insulin aspart  0-5 Units Subcutaneous QHS   insulin detemir  20 Units Subcutaneous Daily   mouth rinse  15 mL Mouth Rinse 10 times per day   sodium chloride flush  10-40 mL Intracatheter Q12H   sodium chloride flush  3 mL Intravenous Q12H   Continuous Infusions:  remdesivir 100 mg in NS 100 mL Stopped (07/04/20 1333)   PRN Meds:.acetaminophen, polyethylene glycol, sodium chloride flush  Current Labs: reviewed    Physical Exam:  Blood pressure (!) 145/78, pulse 54, temperature 97.9 F (36.6 C), temperature source Oral, resp. rate 23, height '5\' 7"'  (1.702 m), weight (!) 105.3 kg, SpO2 97 %. Mild inc WOB, labored to speak, on HF [name]/nt/nd 1-2+ LEE No rashes/lesions  A 1. Renal Insufficiency, improving; suspect has acute and chronic components 1. Nonoliguric 2. GFR improving 3. Suspect Acute issue related to [facility] [facility], diuretic/ARB? 2. COVID19 PNA 3. Hypervolemia, CCM ordered bolus lasix today, seems ok 4. HTN BP stable, held home meds including ARB 5. DM2, a1c 11.3% 6. 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 Intermittent Hyperkalemia, mild; likely 2/2 #1 and  corticosteroids, monitor if < 6, veltassa or lokelma if > 6  P  Cont supportive care  No further recommendations at this tme, lasix prn hypervolemia  Will sign off for now.  Please call with any questions or concerns.  Pt does need follow up with nephrology and [facility] If not we can see in the office once resolved  Medication Issues; o Preferred narcotic agents for pain control are hydromorphone, fentanyl, and methadone. Morphine should not be used.  o Baclofen should be avoided o Avoid oral sodium phosphate and magnesium citrate based laxatives / bowel preps    Pearson Grippe MD 07/05/2020, 1:15 PM  Recent Labs  Lab 07/04/20 0720 07/04/20 1148 07/04/20 1801 07/05/20 0711  NA 144  --  143 142  K 5.2*  --  4.6 5.4*  CL 114*  --  112* 112*  CO2 20*  --  20* 19*  GLUCOSE 317*  --  314* 337*  BUN 59*  --  61* 62*  CREATININE 3.72*  --  3.44* 2.99*  CALCIUM 7.7* 7.6* 8.1* 8.0*  PHOS 4.1  --   --  4.1   Recent Labs  Lab 07/05/2020 1415 06/15/2020 1415 06/30/2020 1535 07/04/20 0720 07/05/20 0711  WBC 4.9  --   --  3.3* 7.1  NEUTROABS 3.4  --   --  2.9 5.6  HGB 11.3*   < > 11.9* 10.6* 11.0*  HCT 34.8*   < > 35.0*  33.1* 34.4*  MCV 89.2  --   --  91.2 89.8  PLT 136*  --   --  150 189   < > = values in this interval not displayed.

## 2020-07-05 NOTE — Progress Notes (Addendum)
NAME:  Gregory [name], MRN:  [id number], DOB:  Jan 03, 1951, LOS: 2 ADMISSION DATE:  07-22-20, CONSULTATION DATE:  07-22-2020 REFERRING MD:  ED provider, CHIEF COMPLAINT:  SOB, hypoxia  Brief History   69 yo BM presented to ED on 07/22/2020 with hypoxia on NRB to 15L and confusion, unable to give history. Code sepsis initiated, s/p 2.25L NS fluid resuscitation. Admitted to ICU.  History of present illness   Patient BIBA to ED after daughter (who lives out of state) called EMS because her father sounded confused on the phone. Per EDP's note, patient reportedly had SpO2 in 40s at home. He was placed on NRB and transported to [facility]. Here, patient is oriented to self only. He cannot answer questions about his medical history. In ED, patient on 15L NRB satting in upper 80s low 90s, still confused. Febrile to 101.8*F, tachypneic to 20s-30s. Arterial O2 low to 68, cr elevated to 3.81, elevated LFTs 205/86 AST/ALT, no leukocytosis or ANC elevation, procal elevated to 1.79.  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 Does well with NRB at 15L, desatted with salter yesterday. Occasionally removing HHFNC and desatting, does well when O2 mechanisms remain in place.   Objective   Blood pressure (!) 152/82, pulse 50, temperature 97.6 F (36.4 C), temperature source Oral, resp. rate 23, height 5\' 7"  (1.702 m), weight (!) 105.3 kg, SpO2 99 %.    FiO2 (%):  [100 %] 100 %   Intake/Output Summary (Last 24 hours) at 07/05/2020 0755 Last data filed at  07/05/2020 07/07/2020 Gross per 24 hour  Intake 841.31 ml  Output 1050 ml  Net -208.69 ml   Filed Weights   07/04/20 0410 07/05/20 0500  Weight: (!) 105.8 kg (!) 105.3 kg    Examination: General: older BM resting in bed, NARD HENT: HHFNC in place Lungs: diminished air flow at bases, but air present in all lung fields, no crackles/wheezing/rhonchi appreciated Cardiovascular: regular rhythm, bradycardic, no m/r/g Abdomen: soft, NTND, normal bowel sounds present Extremities: warm, edema to +3 mid shins Neuro: no focal deficits, AOx3 GU: not examined.  Resolved Hospital Problem list     Assessment & Plan:  Acute Hypoxic Respiratory Failure d/t COVID-19 - Oxygen therapy: maintain sats >88% SpO2, currently on 15L HHFNC - Continuous cardiac and pulse ox monitoring - AM labs: CBC, CMP, ABG, follow inflammatory markers - Echo with 55-60% EF, normal - Blood cx and urine cx NGTD - s/p Vanc, Cefepime, Flagyl x1 in ED 7/27 - Heparin SQ for DVT ppx - Acetaminophen for fever - s/p Actemra x1 7/28 - Decadron 6mg  qd x10d (7/28-8/6) - Remdesivir x 5d (7/28-8/1) - Patient has continued to steadily improve mentation and oxygenation, now likely at baseline of AOx3. Ok to transfer to floor.  Acute Renal Failure vs CKD Cr elevated to 3.81 at admission, improved to 2.99 today. Unsure baseline, but likely CKD d/t PMH and 02-09-1995. - Will consult nephrology, appreciate recommendations: continue to monitor, does not need HD at this time - UOP diminished, 0.4 mL/kg/hr, will diurese with  lasix as appears volume overloaded - avoid nephrotoxic medications - U P:C 3 g/d - Renal US with medical-renal disease, no other abnormalitiy - AM CMP - Judicious fluid administration - s/p 2.25L NS resuscitation at admission  Elevated Transaminases- improved AST/ALT improved to 101/86 yesterday,  likely explained by COVID-19. Will obtain hepatitis panel. Will continue to monitor as remdesivir started. - Daily CMP - Hep C  negative, Hep B surface ag negative, but surface ab also negative, no immunity to Hep B. - Recommend Hep B vaccine  Hyperglycemia BG elevated 290-314, uncontrolled DMT2 w/ A1c of 11%.  - CBG TID AC, qHS - Increase to mod SSI - Increase Levemir 20U qd  Hyperkalemia Potassium increased to 5.4 - Increased insulin today - Repeat BMP at 1500, if not improved, can add lokelma  Best practice:  Diet: Carb modified Pain/Anxiety/Delirium protocol (if indicated): n/a VAP protocol (if indicated): n/a DVT prophylaxis: heparin  GI prophylaxis: n/a Glucose control: SSI Mobility: bed rest Code Status: Full Family Communication: Dr. Leary Roca spoke with daughter Disposition: transition to floor  Labs   CBC: Recent Labs  Lab 07/21/2020 1415 21-Jul-2020 1535 07/04/20 0720  WBC 4.9  --  3.3*  NEUTROABS 3.4  --  2.9  HGB 11.3* 11.9* 10.6*  HCT 34.8* 35.0* 33.1*  MCV 89.2  --  91.2  PLT 136*  --  150    Basic Metabolic Panel: Recent Labs  Lab 07-21-20 1415 Jul 21, 2020 1535 07/04/20 0720 07/04/20 1801  NA 138 141 144 143  K 4.5 4.3 5.2* 4.6  CL 107  --  114* 112*  CO2 20*  --  20* 20*  GLUCOSE 322*  --  317* 314*  BUN 51*  --  59* 61*  CREATININE 3.81*  --  3.72* 3.44*  CALCIUM 8.2*  --  7.7* 8.1*  MG  --   --  2.2  --   PHOS  --   --  4.1  --    GFR: Estimated Creatinine Clearance: 24.1 mL/min (A) (by C-G formula based on SCr of 3.44 mg/dL (H)). Recent Labs  Lab 07-21-2020 1415 07/04/20 0720  PROCALCITON 1.79  --   WBC 4.9 3.3*  LATICACIDVEN 1.9  --     Liver Function Tests: Recent Labs  Lab Jul 21, 2020 1415 07/04/20 0720  AST 205* 231*  ALT 86* 113*  ALKPHOS 40 40  BILITOT 0.6 0.6  PROT 6.0* 5.7*  ALBUMIN 2.0* 1.8*   No results for input(s): LIPASE, AMYLASE in the last 168 hours. No results for input(s): AMMONIA in the last 168 hours.  ABG    Component Value Date/Time   PHART 7.375 07/21/20 1535   PCO2ART 33.7 21-Jul-2020 1535   PO2ART 68 (L) July 21, 2020 1535    HCO3 19.7 (L) Jul 21, 2020 1535   TCO2 21 (L) 07/21/20 1535   ACIDBASEDEF 5.0 (H) July 21, 2020 1535   O2SAT 93.0 07-21-2020 1535     Coagulation Profile: Recent Labs  Lab July 21, 2020 1415  INR 1.0    Cardiac Enzymes: Recent Labs  Lab 07-21-20 1830  CKTOTAL 876*    HbA1C: Hgb A1c MFr Bld  Date/Time Value Ref Range Status  07/04/2020 07:20 AM 11.3 (H) 4.8 - 5.6 % Final    Comment:    (NOTE) Pre diabetes:          5.7%-6.4%  Diabetes:              >6.4%  Glycemic control for   <7.0% adults with diabetes  CBG: Recent Labs  Lab 07/04/20 1110 07/04/20 1649 07/04/20 1944 07/04/20 2318 07/05/20 0311  GLUCAP 290* 290* 314* 276* 287*    Review of Systems:   Unable to elicit  Past Medical History  He,  has no past medical history on file.   Surgical History    Unable to elicit Social History    Unable to elicit  Family History   His family history is not on file.   Allergies No Known Allergies   Home Medications  Prior to Admission medications   Not on File     Critical care time:      Shirlean Mylar, MD [facility], PGY-2

## 2020-07-05 NOTE — TOC Progression Note (Addendum)
Transition of Care [facility]) - Progression Note    Patient Details  Name: Jamarkus Lisbon MRN: [id number] Date of Birth: 12-02-1951  Transition of Care [facility]) CM/SW Contact  Bess Kinds, RN Phone Number: [phone] 07/05/2020, 2:19 PM  Clinical Narrative:     Notified by April at [facility] that patient is now set up with [facility]. His provider is Almetta Lovely. Patient has an existing appointment scheduled for 8/19 at 1pm. Patient is 100% service connected. Left message for [facility] social worker, Maggie to request OT consult for home care needs. TOC following for transition needs.   Update: [name] contacted with [facility] contact information, including provider and social work information. Pending appointment information provided.  Expected Discharge Plan: Home w Home Health Services Barriers to Discharge: Continued Medical Work up  Expected Discharge Plan and Services Expected Discharge Plan: Home w Home Health Services   Discharge Planning Services: CM Consult   Living arrangements for the past 2 months: Single Family Home                                       Social Determinants of Health (SDOH) Interventions    Readmission Risk Interventions No flowsheet data found.

## 2020-07-05 NOTE — TOC Progression Note (Signed)
Transition of Care [facility]) - Progression Note    Patient Details  Name: Gregory [name] MRN: [id number] Date of Birth: 02/06/1951  Transition of Care [facility]) CM/SW Contact  Bess Kinds, RN Phone Number: [phone] 07/05/2020, 3:23 PM  Clinical Narrative:     Sherron Monday with [name] about [facility] agency for RN and PT. She would like [employer] stating that she has been talking to [place]. Discussed the difference between home care and home health. [facility] will assist with home care agency needs. Referral for home health sent to [facility]. Spoke with Kathlene November at [place] about [facility] RN and PT. Patient will need HH orders for RN and PT with Face to Face. Notified MD for orders. TOC following for transition needs.    Expected Discharge Plan: Home w Home Health Services Barriers to Discharge: Continued Medical Work up  Expected Discharge Plan and Services Expected Discharge Plan: Home w Home Health Services   Discharge Planning Services: CM Consult Post Acute Care Choice: Home Health Living arrangements for the past 2 months: Single Family Home                           HH Arranged: PT, RN HH Agency: [facility] Date [facility] Agency Contacted: 07/05/20 Time HH Agency Contacted: 1523 Representative spoke with at [facility] Agency: Kathlene November   Social Determinants of Health (SDOH) Interventions    Readmission Risk Interventions No flowsheet data found.

## 2020-07-05 NOTE — TOC Progression Note (Signed)
Transition of Care [facility]) - Progression Note    Patient Details  Name: Gregory [name] MRN: [id number] Date of Birth: 10-10-1951  Transition of Care [facility]) CM/SW Contact  Bess Kinds, RN Phone Number: [phone] 07/05/2020, 1:10 PM  Clinical Narrative:     Spoke with patient's daughter, [name], on the phone. [name] has returned to [place] d/t needing to be present for her 69 year old daughter. [name] stated that her mom is able to manage basic adls/Iadls, and that a vaccinated neighbor is able to check in on her. [name] stated that her mom has diagnosed Covid+ yesterday despite having been fully vaccinated. [name] stated that patient has not been vaccinated. [name] has spoken with her mom twice this morning. Her mom is limited with verbal skills d/t a stroke, however, she can say yes and no.   Discussed call to [facility] to inquire if patient had enrolled - message left, pending call back. [name] would like patient to be able to get home care via [place] at discharge.   TOC following for transition needs.   Expected Discharge Plan: Home w Home Health Services Barriers to Discharge: Continued Medical Work up  Expected Discharge Plan and Services Expected Discharge Plan: Home w Home Health Services   Discharge Planning Services: CM Consult   Living arrangements for the past 2 months: Single Family Home                                       Social Determinants of Health (SDOH) Interventions    Readmission Risk Interventions No flowsheet data found.

## 2020-07-06 ENCOUNTER — Inpatient Hospital Stay (HOSPITAL_COMMUNITY): Payer: No Typology Code available for payment source

## 2020-07-06 DIAGNOSIS — R7989 Other specified abnormal findings of blood chemistry: Secondary | ICD-10-CM

## 2020-07-06 DIAGNOSIS — U071 COVID-19: Secondary | ICD-10-CM

## 2020-07-06 LAB — CBC WITH DIFFERENTIAL/PLATELET
Abs Immature Granulocytes: 0.69 10*3/uL — ABNORMAL HIGH (ref 0.00–0.07)
Basophils Absolute: 0 10*3/uL (ref 0.0–0.1)
Basophils Relative: 0 %
Eosinophils Absolute: 0 10*3/uL (ref 0.0–0.5)
Eosinophils Relative: 0 %
HCT: 36.6 % — ABNORMAL LOW (ref 39.0–52.0)
Hemoglobin: 12 g/dL — ABNORMAL LOW (ref 13.0–17.0)
Immature Granulocytes: 7 %
Lymphocytes Relative: 11 %
Lymphs Abs: 1.1 10*3/uL (ref 0.7–4.0)
MCH: 29 pg (ref 26.0–34.0)
MCHC: 32.8 g/dL (ref 30.0–36.0)
MCV: 88.4 fL (ref 80.0–100.0)
Monocytes Absolute: 0.5 10*3/uL (ref 0.1–1.0)
Monocytes Relative: 5 %
Neutro Abs: 8.1 10*3/uL — ABNORMAL HIGH (ref 1.7–7.7)
Neutrophils Relative %: 77 %
Platelets: 186 10*3/uL (ref 150–400)
RBC: 4.14 MIL/uL — ABNORMAL LOW (ref 4.22–5.81)
RDW: 12.7 % (ref 11.5–15.5)
WBC: 10.4 10*3/uL (ref 4.0–10.5)
nRBC: 0.6 % — ABNORMAL HIGH (ref 0.0–0.2)

## 2020-07-06 LAB — GLUCOSE, CAPILLARY
Glucose-Capillary: 266 mg/dL — ABNORMAL HIGH (ref 70–99)
Glucose-Capillary: 301 mg/dL — ABNORMAL HIGH (ref 70–99)
Glucose-Capillary: 294 mg/dL — ABNORMAL HIGH (ref 70–99)
Glucose-Capillary: 331 mg/dL — ABNORMAL HIGH (ref 70–99)
Glucose-Capillary: 252 mg/dL — ABNORMAL HIGH (ref 70–99)

## 2020-07-06 LAB — COMPREHENSIVE METABOLIC PANEL
ALT: 72 U/L — ABNORMAL HIGH (ref 0–44)
AST: 54 U/L — ABNORMAL HIGH (ref 15–41)
Albumin: 1.9 g/dL — ABNORMAL LOW (ref 3.5–5.0)
Alkaline Phosphatase: 70 U/L (ref 38–126)
Anion gap: 10 (ref 5–15)
BUN: 73 mg/dL — ABNORMAL HIGH (ref 8–23)
CO2: 20 mmol/L — ABNORMAL LOW (ref 22–32)
Calcium: 8.2 mg/dL — ABNORMAL LOW (ref 8.9–10.3)
Chloride: 113 mmol/L — ABNORMAL HIGH (ref 98–111)
Creatinine, Ser: 3.07 mg/dL — ABNORMAL HIGH (ref 0.61–1.24)
GFR calc Af Amer: 23 mL/min — ABNORMAL LOW (ref >60)
GFR calc non Af Amer: 20 mL/min — ABNORMAL LOW (ref >60)
Glucose, Bld: 305 mg/dL — ABNORMAL HIGH (ref 70–99)
Potassium: 4.8 mmol/L (ref 3.5–5.1)
Sodium: 143 mmol/L (ref 135–145)
Total Bilirubin: 0.7 mg/dL (ref 0.3–1.2)
Total Protein: 6.3 g/dL — ABNORMAL LOW (ref 6.5–8.1)

## 2020-07-06 LAB — MAGNESIUM: Magnesium: 2.6 mg/dL — ABNORMAL HIGH (ref 1.7–2.4)

## 2020-07-06 LAB — D-DIMER, QUANTITATIVE: D-Dimer, Quant: >20 ug/mL-FEU — ABNORMAL HIGH (ref 0.00–0.50)

## 2020-07-06 LAB — C-REACTIVE PROTEIN: CRP: 5.2 mg/dL — ABNORMAL HIGH (ref <1.0)

## 2020-07-06 LAB — FERRITIN: Ferritin: 4320 ng/mL — ABNORMAL HIGH (ref 24–336)

## 2020-07-06 LAB — PHOSPHORUS: Phosphorus: 4.3 mg/dL (ref 2.5–4.6)

## 2020-07-06 MED ORDER — INSULIN ASPART 100 UNIT/ML ~~LOC~~ SOLN
6.0000 [IU] | Freq: Three times a day (TID) | SUBCUTANEOUS | Status: DC
  Administered 2020-07-06 – 2020-07-09 (×4): 6 [IU] via SUBCUTANEOUS

## 2020-07-06 MED ORDER — FUROSEMIDE 10 MG/ML IJ SOLN
40.0000 mg | Freq: Once | INTRAMUSCULAR | Status: AC
  Administered 2020-07-06: 40 mg via INTRAVENOUS
  Filled 2020-07-06: qty 4

## 2020-07-06 MED ORDER — LOSARTAN POTASSIUM 50 MG PO TABS: 100.0000 mg | ORAL_TABLET | Freq: Every day | ORAL | Status: DC

## 2020-07-06 MED ORDER — INSULIN DETEMIR 100 UNIT/ML ~~LOC~~ SOLN
30.0000 [IU] | Freq: Every day | SUBCUTANEOUS | Status: DC
  Administered 2020-07-06: 30 [IU] via SUBCUTANEOUS
  Filled 2020-07-06: qty 0.3

## 2020-07-06 MED ORDER — ORAL CARE MOUTH RINSE
15.0000 mL | Freq: Two times a day (BID) | OROMUCOSAL | Status: DC
  Administered 2020-07-08 – 2020-07-11 (×7): 15 mL via OROMUCOSAL

## 2020-07-06 MED ORDER — PNEUMOCOCCAL VAC POLYVALENT 25 MCG/0.5ML IJ INJ
0.5000 mL | INJECTION | INTRAMUSCULAR | Status: DC | PRN
  Filled 2020-07-06: qty 0.5

## 2020-07-06 MED ORDER — INSULIN ASPART 100 UNIT/ML ~~LOC~~ SOLN
3.0000 [IU] | Freq: Three times a day (TID) | SUBCUTANEOUS | Status: DC
  Administered 2020-07-06 (×2): 3 [IU] via SUBCUTANEOUS

## 2020-07-06 MED ORDER — INSULIN DETEMIR 100 UNIT/ML ~~LOC~~ SOLN
20.0000 [IU] | Freq: Two times a day (BID) | SUBCUTANEOUS | Status: DC
  Administered 2020-07-08 – 2020-07-09 (×3): 20 [IU] via SUBCUTANEOUS
  Filled 2020-07-06 (×6): qty 0.2

## 2020-07-06 MED ORDER — CLONIDINE HCL 0.1 MG PO TABS
0.1000 mg | ORAL_TABLET | Freq: Three times a day (TID) | ORAL | Status: DC | PRN
  Administered 2020-07-06: 0.1 mg via ORAL
  Filled 2020-07-06: qty 1

## 2020-07-06 MED ORDER — DEXAMETHASONE SODIUM PHOSPHATE 10 MG/ML IJ SOLN
10.0000 mg | INTRAMUSCULAR | Status: DC
  Administered 2020-07-06 – 2020-07-10 (×5): 10 mg via INTRAVENOUS
  Filled 2020-07-06 (×5): qty 1

## 2020-07-06 MED ORDER — INSULIN DETEMIR 100 UNIT/ML ~~LOC~~ SOLN
10.0000 [IU] | Freq: Once | SUBCUTANEOUS | Status: AC
  Administered 2020-07-06: 10 [IU] via SUBCUTANEOUS
  Filled 2020-07-06: qty 0.1

## 2020-07-06 MED ORDER — CHLORHEXIDINE GLUCONATE 0.12 % MT SOLN
15.0000 mL | Freq: Two times a day (BID) | OROMUCOSAL | Status: DC
  Administered 2020-07-08 – 2020-07-11 (×8): 15 mL via OROMUCOSAL
  Filled 2020-07-06 (×9): qty 15

## 2020-07-06 MED ORDER — PANTOPRAZOLE SODIUM 40 MG PO TBEC
40.0000 mg | DELAYED_RELEASE_TABLET | Freq: Every day | ORAL | Status: DC
  Administered 2020-07-06 – 2020-07-09 (×3): 40 mg via ORAL
  Filled 2020-07-06 (×3): qty 1

## 2020-07-06 MED ORDER — APIXABAN 5 MG PO TABS
5.0000 mg | ORAL_TABLET | Freq: Two times a day (BID) | ORAL | Status: DC
  Administered 2020-07-06 – 2020-07-07 (×3): 5 mg via ORAL
  Filled 2020-07-06 (×4): qty 1

## 2020-07-06 NOTE — Progress Notes (Signed)
PROGRESS NOTE                                                                                                                                                                                                             Patient Demographics:    Gregory [name], is a 69 y.o. male, DOB - [birth year], BSW:[id number]  Admit date - 06/25/2020   Admitting Physician Gladys Damme, MD  Outpatient Primary MD for the patient is [facility], [facility]  LOS - 3   Chief Complaint  Patient presents with  . Respiratory Distress       Brief Narrative    69 yo BM presented to ED on 06/09/2020 with hypoxia on NRB to 15L and confusion, unable to give history. Code sepsis initiated, s/p 2.25L NS fluid resuscitation. Admitted to ICU.Patient brought to ED after daughter (who lives out of state) called EMS because her father sounded confused on the phone. Per EDP's note, patient reportedly had SpO2 in 40s at home. He was placed on NRB and transported to [facility] Here, patient is oriented to self only. He cannot answer questions about his medical history. In ED, patient on 15L NRB satting in upper 80s low 90s, still confused. Febrile to 101.8*F, tachypneic to 20s-30s. Arterial O2 low to 68, cr elevated to 3.81, elevated LFTs 205/86 AST/ALT, no leukocytosis or ANC elevation, procal elevated to 1.79.  Need to confirm acute hypoxic respiratory failure due to COVID-19 pneumonia.   Subjective:    Gregory [name] today is mildly confused, but answers most questions appropriately, he himself denies any complaints today.    Assessment  & Plan :    Active Problems:   Sepsis ([place])   Hypoxia   Shortness of breath  Acute hypoxic respiratory failure due to COVID-19 pneumonia -Patient with significant oxygen requirement on presentation, this has been improving slowly, this morning he is on heated high flow 20 L at 60%. -Continue with IV steroids, I have increased his Decadron to 10 mg  IV daily -Continue with IV Decadron. -Received Actemra 7/28 -Continue to monitor inflammatory markers closely, CRP trending down, please see discussion below regarding elevated D-dimers. -Diuresis as needed, will give 40 mg of IV Lasix today.  SpO2: 100 % O2 Flow Rate (L/min): 20 L/min FiO2 (%): (S) 60 %     COVID-19 Labs  Recent Labs  07/04/20 0720 07/05/20 0711 07/06/20 0846  DDIMER 5.06* 6.50* >20.00*  FERRITIN 4,824* 5,962* 4,320*  CRP 14.6* 9.5* 5.2*    Lab Results  Component Value Date   SARSCOV2NAA POSITIVE (A) 06/12/2020    Renal insufficiency, most likely acute on chronic, unclear baseline. -Improving gradually, renal input greatly appreciated, nonoliguric, most likely related to COVID-19, in the setting of diuretic/ARB use.  Elevated D-dimers -Significantly elevated today >20, patient at high risk for VTE in the setting of his poor ambulation and COVID-19 status, will start on full anticoagulation empirically pending VTE work-up, will start on Eliquis given his renal failure, and unable to start on full dose Lovenox, will obtain venous Doppler to rule out DVT meanwhile.   Hypertension -Blood pressure is acceptable, continue to monitor on as needed hydralazine  Diabetes mellitus -With A1c of 11.3 -Poorly controlled, started on 6 units NovoLog before meals, will increase his Levemir to 20 units twice daily.    Code Status : Full  Family Communication  : Daughter updated by phone.  Disposition Plan  :  Status is: Inpatient  Remains inpatient appropriate because:IV treatments appropriate due to intensity of illness or inability to take PO   Dispo: The patient is from: Home              Anticipated d/c is to: Home              Anticipated d/c date is: > 3 days              Patient currently is not medically stable to d/c.     Consults  :  PCCM  Procedures  : None  DVT Prophylaxis  :  [place] heparin>> Eliquis  Lab Results  Component Value Date     PLT 186 07/06/2020    Antibiotics  :    Anti-infectives (From admission, onward)   Start     Dose/Rate Route Frequency Ordered Stop   07/04/20 1000  remdesivir 100 mg in sodium chloride 0.9 % 100 mL IVPB     Discontinue    "Followed by" Linked Group Details   100 mg 200 mL/hr over 30 Minutes Intravenous Daily 07/05/2020 1953 07/08/20 0959   06/23/2020 2000  remdesivir 200 mg in sodium chloride 0.9% 250 mL IVPB       "Followed by" Linked Group Details   200 mg 580 mL/hr over 30 Minutes Intravenous Once 06/12/2020 1953 06/19/2020 2107   06/27/2020 1445  vancomycin (VANCOREADY) IVPB 2000 mg/400 mL        2,000 mg 200 mL/hr over 120 Minutes Intravenous  Once 06/12/2020 1431 07/02/2020 1827   06/06/2020 1415  ceFEPIme (MAXIPIME) 2 g in sodium chloride 0.9 % 100 mL IVPB        2 g 200 mL/hr over 30 Minutes Intravenous  Once 06/21/2020 1411 06/06/2020 1530   06/15/2020 1415  metroNIDAZOLE (FLAGYL) IVPB 500 mg        500 mg 100 mL/hr over 60 Minutes Intravenous  Once 07/04/2020 1411 06/12/2020 1601   06/28/2020 1415  vancomycin (VANCOCIN) IVPB 1000 mg/200 mL premix  Status:  Discontinued        1,000 mg 200 mL/hr over 60 Minutes Intravenous  Once 06/15/2020 1411 06/09/2020 1601        Objective:   Vitals:   07/06/20 0900 07/06/20 1000 07/06/20 1123 07/06/20 1150  BP: (!) 147/82 (!) 144/74 (!) 146/93   Pulse: 62 62 61   Resp: 20 (!) 24 21  Temp:    97.6 F (36.4 C)  TempSrc:    Oral  SpO2: (!) 88% (!) 88% 100%   Weight:      Height:        Wt Readings from Last 3 Encounters:  07/06/20 (!) 103.8 kg     Intake/Output Summary (Last 24 hours) at 07/06/2020 1308 Last data filed at 07/06/2020 1000 Gross per 24 hour  Intake --  Output 1100 ml  Net -1100 ml     Physical Exam  Awake Alert, he is mildly confused, but answering most questions appropriately and follow commands.  Symmetrical Chest wall movement, Good air movement bilaterally, scattered rail RRR,No Gallops,Rubs or new Murmurs, No  Parasternal Heave +ve B.Sounds, Abd Soft, No tenderness, No organomegaly appriciated, No rebound - guarding or rigidity. No Cyanosis, Clubbing ,+1  edema, No new Rash or bruise      Data Review:    CBC Recent Labs  Lab 06/24/2020 1415 07/02/2020 1535 07/04/20 0720 07/05/20 0711 07/06/20 0846  WBC 4.9  --  3.3* 7.1 10.4  HGB 11.3* 11.9* 10.6* 11.0* 12.0*  HCT 34.8* 35.0* 33.1* 34.4* 36.6*  PLT 136*  --  150 189 186  MCV 89.2  --  91.2 89.8 88.4  MCH 29.0  --  29.2 28.7 29.0  MCHC 32.5  --  32.0 32.0 32.8  RDW 12.3  --  12.4 12.5 12.7  LYMPHSABS 1.0  --  0.3* 0.9 1.1  MONOABS 0.5  --  0.0* 0.5 0.5  EOSABS 0.0  --  0.0 0.0 0.0  BASOSABS 0.0  --  0.0 0.0 0.0    Chemistries  Recent Labs  Lab 06/24/2020 1415 06/24/2020 1535 07/04/20 0720 07/04/20 1148 07/04/20 1801 07/05/20 0711 07/05/20 1420 07/06/20 0846  NA 138   < > 144  --  143 142 141 143  K 4.5   < > 5.2*  --  4.6 5.4* 4.9 4.8  CL 107   < > 114*  --  112* 112* 112* 113*  CO2 20*   < > 20*  --  20* 19* 18* 20*  GLUCOSE 322*   < > 317*  --  314* 337* 365* 305*  BUN 51*   < > 59*  --  61* 62* 66* 73*  CREATININE 3.81*   < > 3.72*  --  3.44* 2.99* 3.17* 3.07*  CALCIUM 8.2*   < > 7.7* 7.6* 8.1* 8.0* 8.0* 8.2*  MG  --   --  2.2  --   --  2.4  --  2.6*  AST 205*  --  231*  --   --  101*  --  54*  ALT 86*  --  113*  --   --  86*  --  72*  ALKPHOS 40  --  40  --   --  50  --  70  BILITOT 0.6  --  0.6  --   --  0.4  --  0.7   < > = values in this interval not displayed.   ------------------------------------------------------------------------------------------------------------------ No results for input(s): CHOL, HDL, LDLCALC, TRIG, CHOLHDL, LDLDIRECT in the last 72 hours.  Lab Results  Component Value Date   HGBA1C 11.3 (H) 07/04/2020   ------------------------------------------------------------------------------------------------------------------ No results for input(s): TSH, T4TOTAL, T3FREE, THYROIDAB in the last  72 hours.  Invalid input(s): FREET3 ------------------------------------------------------------------------------------------------------------------ Recent Labs    07/05/20 0711 07/06/20 0846  FERRITIN 5,962* 4,320*    Coagulation profile Recent Labs  Lab 06/20/2020 1415  INR 1.0    Recent Labs    07/05/20 0711 07/06/20 0846  DDIMER 6.50* >20.00*    Cardiac Enzymes No results for input(s): CKMB, TROPONINI, MYOGLOBIN in the last 168 hours.  Invalid input(s): CK ------------------------------------------------------------------------------------------------------------------    Component Value Date/Time   BNP 78.5 06/09/2020 1415    Inpatient Medications  Scheduled Meds: . chlorhexidine gluconate (MEDLINE KIT)  15 mL Mouth Rinse BID  . Chlorhexidine Gluconate Cloth  6 each Topical Q0600  . dexamethasone (DECADRON) injection  10 mg Intravenous Q24H  . docusate sodium  100 mg Oral BID  . heparin  5,000 Units Subcutaneous Q8H  . insulin aspart  0-15 Units Subcutaneous TID WC  . insulin aspart  0-5 Units Subcutaneous QHS  . insulin aspart  3 Units Subcutaneous TID WC  . insulin detemir  30 Units Subcutaneous Daily  . mouth rinse  15 mL Mouth Rinse 10 times per day  . pantoprazole  40 mg Oral Daily  . sodium chloride flush  10-40 mL Intracatheter Q12H  . sodium chloride flush  3 mL Intravenous Q12H   Continuous Infusions: . remdesivir 100 mg in NS 100 mL 100 mg (07/06/20 1042)   PRN Meds:.acetaminophen, polyethylene glycol, sodium chloride flush  Micro Results Recent Results (from the past 240 hour(s))  Blood Culture (routine x 2)     Status: None (Preliminary result)   Collection Time: 06/24/2020  2:10 PM   Specimen: BLOOD  Result Value Ref Range Status   Specimen Description BLOOD RIGHT ANTECUBITAL  Final   Special Requests   Final    BOTTLES DRAWN AEROBIC AND ANAEROBIC Blood Culture results may not be optimal due to an inadequate volume of blood received in  culture bottles   Culture   Final    NO GROWTH 3 DAYS Performed at [facility], [place] [address] [place], [place] [address]    Report Status PENDING  Incomplete  Blood Culture (routine x 2)     Status: None (Preliminary result)   Collection Time: 06/11/2020  2:38 PM   Specimen: BLOOD  Result Value Ref Range Status   Specimen Description BLOOD LEFT ANTECUBITAL  Final   Special Requests   Final    BOTTLES DRAWN AEROBIC ONLY Blood Culture results may not be optimal due to an inadequate volume of blood received in culture bottles   Culture   Final    NO GROWTH 3 DAYS Performed at [facility], [place] [address], [place], [place] [address]    Report Status PENDING  Incomplete  SARS Coronavirus 2 by RT PCR (hospital order, performed in [facility] hospital lab) Nasopharyngeal Nasopharyngeal Swab     Status: Abnormal   Collection Time: 06/17/2020  2:47 PM   Specimen: Nasopharyngeal Swab  Result Value Ref Range Status   SARS Coronavirus 2 POSITIVE (A) NEGATIVE Final    Comment: RESULT CALLED TO, READ BACK BY AND VERIFIED WITH: Marcille Blanco MD _0  06/13/2020 EB K,SPRANGERS RN _1  07/04/2020 EB (NOTE) SARS-CoV-2 target nucleic acids are DETECTED  SARS-CoV-2 RNA is generally detectable in upper respiratory specimens  during the acute phase of infection.  Positive results are indicative  of the presence of the identified virus, but do not rule out bacterial infection or co-infection with other pathogens not detected by the test.  Clinical correlation with patient history and  other diagnostic information is necessary to determine patient infection status.  The expected result is negative.  Fact Sheet for Patients:   StrictlyIdeas.no  Fact Sheet for Healthcare Providers:   BankingDealers.co.za    This test is not yet approved or cleared by the [place] FDA and  has been authorized for detection and/or diagnosis of SARS-CoV-2  by FDA under an Emergency Use Authorization (EUA).  This EUA will  remain in effect (meaning this test can be used) for the duration of  the COVID-19 declaration under Section 564(b)(1) of the Act, 21 U.S.C. section 360-bbb-3(b)(1), unless the authorization is terminated or revoked sooner.  Performed at [facility], [place] [address] [place], [place] [address]   Urine culture     Status: Abnormal   Collection Time: 06/24/2020  4:15 PM   Specimen: In/Out Cath Urine  Result Value Ref Range Status   Specimen Description IN/OUT CATH URINE  Final   Special Requests NONE  Final   Culture (A)  Final    <10,000 COLONIES/mL INSIGNIFICANT GROWTH Performed at [place] [address], [place], [place] [address]    Report Status 07/04/2020 FINAL  Final  MRSA PCR Screening     Status: None   Collection Time: 07/04/20  4:07 AM   Specimen: Nasal Mucosa; Nasopharyngeal  Result Value Ref Range Status   MRSA by PCR NEGATIVE NEGATIVE Final    Comment:        The GeneXpert MRSA Assay (FDA approved for NASAL specimens only), is one component of a comprehensive MRSA colonization surveillance program. It is not intended to diagnose MRSA infection nor to guide or monitor treatment for MRSA infections. Performed at [facility], [place] [address], [place],  [address]     Radiology Reports US RENAL  Result Date: 07/02/2020 CLINICAL DATA:  Acute renal failure EXAM: RENAL / URINARY TRACT ULTRASOUND COMPLETE COMPARISON:  None. FINDINGS: Right Kidney: Renal measurements: 10.2 x 4.5 x 4.3 cm = volume: 103 mL. Mildly increased echogenicity seen within the renal parenchyma. No mass or hydronephrosis visualized. Left Kidney: Renal measurements: 11.9 x 5.8 x 4.0 cm = volume: 157 mL. Mildly increased echogenicity seen throughout the renal parenchyma. No mass or hydronephrosis visualized. Bladder: Appears normal for degree of bladder distention. Other: None. IMPRESSION: Diffusely  increased parenchymal echogenicity, consistent with medical renal disease. No other acute renal abnormality Electronically Signed   By: Prudencio Pair M.D.   On: 06/29/2020 21:21   Portable chest 1 View  Result Date: 07/04/2020 CLINICAL DATA:  Shortness of breath.  COVID-19 positive. EXAM: PORTABLE CHEST 1 VIEW COMPARISON:  July 03, 2020 FINDINGS: Widespread airspace opacity is noted bilaterally, increased from 1 day prior. No frank consolidation. Heart is upper normal in size with pulmonary vascularity normal. No adenopathy. No bone lesions. IMPRESSION: Widespread airspace opacity, overall progressed from 1 day prior, most likely due to widespread atypical organism pneumonia given the history. No consolidation. Stable cardiac silhouette. Electronically Signed   By: Lowella Grip III M.D.   On: 07/04/2020 07:56   DG Chest Port 1 View  Result Date: 06/15/2020 CLINICAL DATA:  Sepsis, short of breath EXAM: PORTABLE CHEST 1 VIEW COMPARISON:  None. FINDINGS: Moderately severe diffuse bilateral airspace disease left greater than right. No pleural effusion. Heart size upper normal. IMPRESSION: Diffuse bilateral airspace disease. Possible pneumonia or edema. Rule out COVID. Electronically Signed   By: Franchot Gallo M.D.   On: 06/06/2020 14:40   ECHOCARDIOGRAM COMPLETE  Result Date: 07/04/2020    ECHOCARDIOGRAM REPORT   Patient Name:   Gregory [name] Date of Exam: 07/04/2020 Medical Rec #:  [id number]       Height:       67.0 in Accession #:    [id number]      Weight:       233.2 lb Date of Birth:  [birth year]        BSA:          2.159 m Patient Age:    52 years        BP:           131/80 mmHg Patient Gender: M               HR:           53 bpm. Exam Location:  Inpatient Procedure: 2D Echo Indications:    sepsis  History:        Patient has no prior history of Echocardiogram examinations.                 Covid +. no prior cardiac hx listed on file.  Sonographer:    Jannett Celestine RDCS (AE) Referring Phys:  [id number] MATTHEW J TRIFAN IMPRESSIONS  1. Left ventricular ejection fraction, by estimation, is 55 to 60%. The left ventricle has normal function. The left ventricle has no regional wall motion abnormalities. There is moderate left ventricular hypertrophy. Left ventricular diastolic parameters are indeterminate.  2. Right ventricular systolic function is normal. The right ventricular size is mildly enlarged. Tricuspid regurgitation signal is inadequate for assessing PA pressure.  3. The mitral valve is normal in structure. No evidence of mitral valve regurgitation.  4. The aortic valve was not well visualized. Aortic valve regurgitation is not visualized. No aortic stenosis is present.  5. The inferior vena cava is dilated in size with >50% respiratory variability, suggesting right atrial pressure of 8 mmHg. FINDINGS  Left Ventricle: Left ventricular ejection fraction, by estimation, is 55 to 60%. The left ventricle has normal function. The left ventricle has no regional wall motion abnormalities. The left ventricular internal cavity size was normal in size. There is  moderate left ventricular hypertrophy. Left ventricular diastolic parameters are indeterminate. Right Ventricle: The right ventricular size is mildly enlarged. Right vetricular wall thickness was not assessed. Right ventricular systolic function is normal. Tricuspid regurgitation signal is inadequate for assessing PA pressure. Left Atrium: Left atrial size was normal in size. Right Atrium: Right atrial size was not well visualized. Pericardium: There is no evidence of pericardial effusion. Mitral Valve: The mitral valve is normal in structure. No evidence of mitral valve regurgitation. Tricuspid Valve: The tricuspid valve is grossly normal. Tricuspid valve regurgitation is not demonstrated. Aortic Valve: The aortic valve was not well visualized. Aortic valve regurgitation is not visualized. No aortic stenosis is present. Pulmonic Valve: The pulmonic  valve was not well visualized. Pulmonic valve regurgitation is not visualized. Aorta: The aortic root is normal in size and structure. Venous: The inferior vena cava is dilated in size with greater than 50% respiratory variability, suggesting right atrial pressure of 8 mmHg. IAS/Shunts: The interatrial septum was not well visualized.  LEFT VENTRICLE PLAX 2D LVIDd:         4.66 cm LVIDs:         3.33 cm LV PW:         1.00 cm LV IVS:        1.19 cm LVOT diam:     2.10 cm LV SV:         47 LV SV Index:   22 LVOT Area:  3.46 cm  LEFT ATRIUM           Index LA diam:      3.40 cm 1.57 cm/m LA Vol (A2C): 47.4 ml 21.96 ml/m  AORTIC VALVE LVOT Vmax:   70.50 cm/s LVOT Vmean:  53.500 cm/s LVOT VTI:    0.135 m  AORTA Ao Root diam: 3.10 cm MITRAL VALVE MV Area (PHT): 4.49 cm    SHUNTS MV Decel Time: 169 msec    Systemic VTI:  0.14 m MV E velocity: 74.60 cm/s  Systemic Diam: 2.10 cm MV A velocity: 72.40 cm/s MV E/A ratio:  1.03 Oswaldo Milian MD Electronically signed by Oswaldo Milian MD Signature Date/Time: 07/04/2020/12:54:11 PM    Final      Phillips Climes M.D on 07/06/2020 at 1:08 PM    [facility] -  Office  [phone]

## 2020-07-06 NOTE — Progress Notes (Signed)
eLink Physician-Brief Progress Note Patient Name: Gregory [name] DOB: 08/24/1951 MRN: [id number]   Date of Service  07/06/2020  HPI/Events of Note  Hypertension - BP = 172/89 with MAP = 111. Creatine = 3.07. Will refrain from restarting home Cozaar.   eICU Interventions  Plan: 1. Catapres 0.1 mg PO Q 8 hours PRN SBP > 170 or DBP > 100.      Intervention Category Major Interventions: Hypertension - evaluation and management  Riaan Toledo Eugene 07/06/2020, 10:49 PM

## 2020-07-06 NOTE — Progress Notes (Signed)
Physical Therapy Treatment Patient Details Name: Gregory [name] MRN: [id number] DOB: 04-19-1951 Today's Date: 07/06/2020    History of Present Illness 69 yo BM presented to ED on 06/21/2020 with hypoxia on NRB to 15L and confusion, unable to give history. Code sepsis initiated, s/p 2.25L NS fluid resuscitation. Admitted to ICU.Marland Kitchen  PMHx not available as of evaluation.    PT Comments    Pt with flat affect staring off at the walls on arrival, but perked up once he was engaged by the therapist.  Emphasis on transitions, UE resistance exercise, LE standing exercise, pre gait/marching in place, before transfer to the chair.  Pt's sats mid 80's on 20L HHFNC, 88-90% on 25L, and 96-99% on 30L,  EHR <100 bpm    Follow Up Recommendations  Home health PT;Supervision/Assistance - 24 hour     Equipment Recommendations  None recommended by PT    Recommendations for Other Services       Precautions / Restrictions Precautions Precautions: Fall Restrictions Weight Bearing Restrictions: No    Mobility  Bed Mobility Overal bed mobility: Needs Assistance Bed Mobility: Supine to Sit     Supine to sit: Supervision     General bed mobility comments: HOB mildly raised for no reason.  pt easily transitions to EOB  Transfers Overall transfer level: Needs assistance   Transfers: Sit to/from Stand Sit to Stand: Supervision         General transfer comment: cues to use hands to push off and reach back for stable surface.  Pt sat back on bed uncontrolled x2 with LOB initially  Ambulation/Gait Ambulation/Gait assistance: Min guard   Assistive device: Rolling walker (2 wheeled)     Gait velocity interpretation: <1.8 ft/sec, indicate of risk for recurrent falls General Gait Details: marching in place with min guard after he stabilized up with standing exercise   Stairs             Wheelchair Mobility    Modified Rankin (Stroke Patients Only)       Balance     Sitting  balance-Leahy Scale: Good       Standing balance-Leahy Scale: Fair                              Cognition Arousal/Alertness: Awake/alert Behavior During Therapy: WFL for tasks assessed/performed Overall Cognitive Status: No family/caregiver present to determine baseline cognitive functioning                                 General Comments: follows direction well if heard.      Exercises General Exercises - Lower Extremity Hip ABduction/ADduction: AROM;Strengthening;Both;10 reps;Standing Hip Flexion/Marching: AROM;Strengthening;Both;10 reps;Standing Mini-Sqauts: AROM;Strengthening;10 reps;Standing Other Exercises Other Exercises: bil bicep tricep and shd flexion (to the ceiling) presses with graded resistance. x 10 reps    General Comments General comments (skin integrity, edema, etc.): sats on 20L HHFNC at 84-88% on 30L sats 96-99% so kept if somewhere about 25L while exercising.  HT under 100 bpm      Pertinent Vitals/Pain Pain Assessment: No/denies pain    Home Living                      Prior Function            PT Goals (current goals can now be found in the care plan section) Acute Rehab PT Goals  PT Goal Formulation: Patient unable to participate in goal setting Time For Goal Achievement: 07/18/20 Potential to Achieve Goals: Good Progress towards PT goals: Progressing toward goals    Frequency    Min 3X/week      PT Plan Current plan remains appropriate    Co-evaluation              AM-PAC PT "6 Clicks" Mobility   Outcome Measure  Help needed turning from your back to your side while in a flat bed without using bedrails?: None Help needed moving from lying on your back to sitting on the side of a flat bed without using bedrails?: None Help needed moving to and from a bed to a chair (including a wheelchair)?: A Little Help needed standing up from a chair using your arms (e.g., wheelchair or bedside chair)?: A  Little Help needed to walk in hospital room?: A Little Help needed climbing 3-5 steps with a railing? : A Little 6 Click Score: 20    End of Session Equipment Utilized During Treatment: Oxygen Activity Tolerance: Patient tolerated treatment well;Patient limited by fatigue Patient left: in chair;with call bell/phone within reach;with chair alarm set Nurse Communication: Mobility status PT Visit Diagnosis: Unsteadiness on feet (R26.81);Other abnormalities of gait and mobility (R26.89)     Time: 1010-1048 PT Time Calculation (min) (ACUTE ONLY): 38 min  Charges:  $Gait Training: 8-22 mins $Therapeutic Exercise: 8-22 mins $Therapeutic Activity: 8-22 mins                     07/06/2020  Jacinto Halim., PT Acute Rehabilitation Services [phone]  (pager) [phone]  (office)   Gregory [name] 07/06/2020, 10:59 AM

## 2020-07-06 NOTE — Progress Notes (Signed)
VASCULAR LAB PRELIMINARY  PRELIMINARY  PRELIMINARY  PRELIMINARY  Bilateral lower extremity venous duplex completed.    Preliminary report:  See CV proc for preliminary results.  Messaged Dr. Randol Kern with results.  Kameria Canizares, RVT 07/06/2020, 5:35 PM

## 2020-07-06 NOTE — Progress Notes (Signed)
NAME:  Torion Hulgan, MRN:  [id number], DOB:  03/09/1951, LOS: 3 ADMISSION DATE:  Jul 08, 2020, CONSULTATION DATE:  07-08-20 REFERRING MD:  ED, CHIEF COMPLAINT:  SOB, hypoxemia   Brief History   69 yo BM presented to ED on July 08, 2020 with hypoxia on NRB to 15L and confusion, unable to give history. Code sepsis initiated, s/p 2.25L NS fluid resuscitation. Admitted to ICU.  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 rate 21, height 5\' 7"  (1.702 m), weight (!) 103.8 kg, SpO2 100 %.    FiO2 (%):  [60 %-80 %] 60 %   Intake/Output Summary (Last 24 hours) at 07/06/2020 1220 Last data filed at 07/06/2020 1000 Gross per 24 hour  Intake --  Output 1100 ml  Net -1100 ml   Filed Weights   07/04/20 0410 07/05/20 0500 07/06/20 0500  Weight: (!) 105.8 kg (!) 105.3 kg (!) 103.8 kg    Examination: General: WD/WN NARD on HFNC HENT: [place]/AT; PRRL EOMI Lungs: Clear bilat Cardiovascular: RRR, no m/r/g Abdomen: Supple , NT Extremities: No c/c/e Neuro: No focal deficits A&O  Assessment & Plan:  Acute Hypoxic Respiratory Failure d/t COVID-19 - Oxygen therapy: maintain sats >88% SpO2, currently on 20L HHFNC s/p Actemra x1 7/28 - Decadron 6mg  qd x10d (7/28-8/6) - Remdesivir x 5d (7/28-8/1) - Patient has continued to steadily improve mentation and oxygenation, now likely at baseline of AOx3.   Acute Renal Failure vs CKD 1. Was eval by [facility]. Their view is: Renal  Insufficiency, improving; suspect has acute and chronic components 1. Nonoliguric 2. GFR improving: Cr down to 3.07 today Suspect Acute issue related to COVID19, diuretic/ARB?  Hyperglycemia Glu still~300 On Levemir + Novolog SSI Final adjustments per [facility]  PCCm will sign off  Best practice:  Diet: Carb modified Pain/Anxiety/Delirium protocol (if indicated): N/A VAP protocol (if indicated): N/A DVT prophylaxis: heparin GI prophylaxis: N/A Glucose control: Levemir + SSI Mobility: Up in chair Code Status: Full Disposition: To floor when bed available  Labs   CBC: Recent Labs  Lab 07-08-2020 1415 07/08/2020 1535 07/04/20 0720 07/05/20 0711 07/06/20 0846  WBC 4.9  --  3.3* 7.1 10.4  NEUTROABS 3.4  --  2.9 5.6 8.1*  HGB 11.3* 11.9* 10.6* 11.0* 12.0*  HCT 34.8* 35.0* 33.1* 34.4* 36.6*  MCV 89.2  --  91.2 89.8 88.4  PLT 136*  --  150 189 186    Basic Metabolic Panel: Recent Labs  Lab 07/04/20 0720 07/04/20 1148 07/04/20 1801 07/05/20 0711 07/05/20 1420 07/06/20 0846  NA 144  --  143 142 141 143  K 5.2*  --  4.6 5.4* 4.9 4.8  CL 114*  --  112* 112* 112* 113*  CO2 20*  --  20* 19* 18* 20*  GLUCOSE 317*  --  314* 337* 365* 305*  BUN 59*  --  61* 62* 66* 73*  CREATININE 3.72*  --  3.44* 2.99* 3.17* 3.07*  CALCIUM 7.7* 7.6* 8.1* 8.0*  8.0* 8.2*  MG 2.2  --   --  2.4  --  2.6*  PHOS 4.1  --   --  4.1  --  4.3   GFR: Estimated Creatinine Clearance: 26.8 mL/min (A) (by C-G formula based on SCr of 3.07 mg/dL (H)). Recent Labs  Lab 06/14/2020 1415 07/04/20 0720 07/05/20 0711 07/06/20 0846  PROCALCITON 1.79  --   --   --   WBC 4.9 3.3* 7.1 10.4  LATICACIDVEN 1.9  --   --   --     Liver Function Tests: Recent Labs  Lab 06/22/2020 1415 07/04/20 0720 07/05/20 0711 07/06/20 0846  AST 205* 231* 101* 54*  ALT 86* 113* 86* 72*  ALKPHOS 40 40 50 70  BILITOT 0.6 0.6 0.4 0.7  PROT 6.0* 5.7* 5.6* 6.3*  ALBUMIN 2.0* 1.8* 1.8* 1.9*   No results for input(s): LIPASE,  AMYLASE in the last 168 hours. No results for input(s): AMMONIA in the last 168 hours.  ABG    Component Value Date/Time   PHART 7.375 07/02/2020 1535   PCO2ART 33.7 07/06/2020 1535   PO2ART 68 (L) 07/04/2020 1535   HCO3 19.7 (L) 06/15/2020 1535   TCO2 21 (L) 06/16/2020 1535   ACIDBASEDEF 5.0 (H) 07/05/2020 1535   O2SAT 93.0 06/27/2020 1535     Coagulation Profile: Recent Labs  Lab 07/02/2020 1415  INR 1.0    Cardiac Enzymes: Recent Labs  Lab 06/16/2020 1830  CKTOTAL 876*    HbA1C: Hgb A1c MFr Bld  Date/Time Value Ref Range Status  07/04/2020 07:20 AM 11.3 (H) 4.8 - 5.6 % Final    Comment:    (NOTE) Pre diabetes:          5.7%-6.4%  Diabetes:              >6.4%  Glycemic control for   <7.0% adults with diabetes     CBG: Recent Labs  Lab 07/05/20 1932 07/05/20 2324 07/06/20 0254 07/06/20 0800 07/06/20 1202  GLUCAP 321* 246* 266* 301* 294*    Past Medical History  He,  has no past medical history on file.   Social History      Family History   His family history is not on file.   Allergies No Known Allergies   Home Medications  Prior to Admission medications   Medication Sig Start Date End Date Taking? Authorizing Provider  albuterol (VENTOLIN HFA) 108 (90 Base) MCG/ACT inhaler Inhale 2 puffs into the lungs every 6 (six) hours as needed for wheezing or shortness of breath.   Yes [provider]  atorvastatin (LIPITOR) 20 MG tablet Take 20 mg by mouth at bedtime.   Yes [provider]  carvedilol (COREG) 25 MG tablet Take 25 mg by mouth 2 (two) times daily with a meal.   Yes [provider]  furosemide (LASIX) 40 MG tablet Take 40 mg by mouth daily. 1 additional tablet in the evening if needed for edema   Yes [provider]  losartan (COZAAR) 100 MG tablet Take 100 mg by mouth daily.   Yes [provider]  metFORMIN (GLUCOPHAGE) 500 MG tablet Take 500 mg by mouth 2 (two) times daily with a meal.   Yes  [provider]  OXYBUTYNIN CHLORIDE PO Take 5 mg by mouth daily.   Yes [provider]  potassium chloride (KLOR-CON) 10 MEQ tablet Take 20 mEq by mouth 2 (two) times daily.   Yes [provider]  Critical care time: 30 min

## 2020-07-07 DIAGNOSIS — J069 Acute upper respiratory infection, unspecified: Secondary | ICD-10-CM

## 2020-07-07 DIAGNOSIS — U071 COVID-19: Secondary | ICD-10-CM

## 2020-07-07 LAB — COMPREHENSIVE METABOLIC PANEL
ALT: 60 U/L — ABNORMAL HIGH (ref 0–44)
AST: 50 U/L — ABNORMAL HIGH (ref 15–41)
Albumin: 2.1 g/dL — ABNORMAL LOW (ref 3.5–5.0)
Alkaline Phosphatase: 87 U/L (ref 38–126)
Anion gap: 9 (ref 5–15)
BUN: 87 mg/dL — ABNORMAL HIGH (ref 8–23)
CO2: 21 mmol/L — ABNORMAL LOW (ref 22–32)
Calcium: 8 mg/dL — ABNORMAL LOW (ref 8.9–10.3)
Chloride: 112 mmol/L — ABNORMAL HIGH (ref 98–111)
Creatinine, Ser: 3.11 mg/dL — ABNORMAL HIGH (ref 0.61–1.24)
GFR calc Af Amer: 23 mL/min — ABNORMAL LOW (ref >60)
GFR calc non Af Amer: 20 mL/min — ABNORMAL LOW (ref >60)
Glucose, Bld: 249 mg/dL — ABNORMAL HIGH (ref 70–99)
Potassium: 4.8 mmol/L (ref 3.5–5.1)
Sodium: 142 mmol/L (ref 135–145)
Total Bilirubin: 0.8 mg/dL (ref 0.3–1.2)
Total Protein: 5.3 g/dL — ABNORMAL LOW (ref 6.5–8.1)

## 2020-07-07 LAB — CBC WITH DIFFERENTIAL/PLATELET
Abs Immature Granulocytes: 0.81 10*3/uL — ABNORMAL HIGH (ref 0.00–0.07)
Basophils Absolute: 0 10*3/uL (ref 0.0–0.1)
Basophils Relative: 0 %
Eosinophils Absolute: 0 10*3/uL (ref 0.0–0.5)
Eosinophils Relative: 0 %
HCT: 37.5 % — ABNORMAL LOW (ref 39.0–52.0)
Hemoglobin: 12.2 g/dL — ABNORMAL LOW (ref 13.0–17.0)
Immature Granulocytes: 6 %
Lymphocytes Relative: 13 %
Lymphs Abs: 1.9 10*3/uL (ref 0.7–4.0)
MCH: 28.6 pg (ref 26.0–34.0)
MCHC: 32.5 g/dL (ref 30.0–36.0)
MCV: 87.8 fL (ref 80.0–100.0)
Monocytes Absolute: 0.7 10*3/uL (ref 0.1–1.0)
Monocytes Relative: 5 %
Neutro Abs: 10.9 10*3/uL — ABNORMAL HIGH (ref 1.7–7.7)
Neutrophils Relative %: 76 %
Platelets: 132 10*3/uL — ABNORMAL LOW (ref 150–400)
RBC: 4.27 MIL/uL (ref 4.22–5.81)
RDW: 13 % (ref 11.5–15.5)
WBC: 14.4 10*3/uL — ABNORMAL HIGH (ref 4.0–10.5)
nRBC: 0.6 % — ABNORMAL HIGH (ref 0.0–0.2)

## 2020-07-07 LAB — GLUCOSE, CAPILLARY
Glucose-Capillary: 257 mg/dL — ABNORMAL HIGH (ref 70–99)
Glucose-Capillary: 265 mg/dL — ABNORMAL HIGH (ref 70–99)
Glucose-Capillary: 282 mg/dL — ABNORMAL HIGH (ref 70–99)

## 2020-07-07 LAB — FERRITIN: Ferritin: 2833 ng/mL — ABNORMAL HIGH (ref 24–336)

## 2020-07-07 LAB — C-REACTIVE PROTEIN: CRP: 3.5 mg/dL — ABNORMAL HIGH (ref <1.0)

## 2020-07-07 LAB — D-DIMER, QUANTITATIVE: D-Dimer, Quant: >20 ug/mL-FEU — ABNORMAL HIGH (ref 0.00–0.50)

## 2020-07-07 LAB — MAGNESIUM: Magnesium: 2.6 mg/dL — ABNORMAL HIGH (ref 1.7–2.4)

## 2020-07-07 LAB — PHOSPHORUS: Phosphorus: 5 mg/dL — ABNORMAL HIGH (ref 2.5–4.6)

## 2020-07-07 MED ORDER — INSULIN ASPART 100 UNIT/ML ~~LOC~~ SOLN
0.0000 [IU] | Freq: Three times a day (TID) | SUBCUTANEOUS | Status: DC
  Administered 2020-07-08: 20 [IU] via SUBCUTANEOUS
  Administered 2020-07-08: 15 [IU] via SUBCUTANEOUS
  Administered 2020-07-08: 11 [IU] via SUBCUTANEOUS
  Administered 2020-07-09: 7 [IU] via SUBCUTANEOUS
  Administered 2020-07-09 (×2): 4 [IU] via SUBCUTANEOUS
  Administered 2020-07-10: 3 [IU] via SUBCUTANEOUS

## 2020-07-07 MED ORDER — HALOPERIDOL LACTATE 5 MG/ML IJ SOLN: 2.0000 mg | Freq: Four times a day (QID) | INTRAMUSCULAR | Status: DC | PRN

## 2020-07-07 MED ORDER — INSULIN ASPART 100 UNIT/ML ~~LOC~~ SOLN
0.0000 [IU] | Freq: Every day | SUBCUTANEOUS | Status: DC
  Administered 2020-07-09: 0 [IU] via SUBCUTANEOUS
  Administered 2020-07-10: 3 [IU] via SUBCUTANEOUS

## 2020-07-07 MED ORDER — APIXABAN 5 MG PO TABS
10.0000 mg | ORAL_TABLET | Freq: Two times a day (BID) | ORAL | Status: DC
  Administered 2020-07-08 – 2020-07-09 (×3): 10 mg via ORAL
  Filled 2020-07-07 (×5): qty 2

## 2020-07-07 MED ORDER — APIXABAN 5 MG PO TABS: 5.0000 mg | ORAL_TABLET | Freq: Two times a day (BID) | ORAL | Status: DC

## 2020-07-07 NOTE — Progress Notes (Signed)
Gregory [name] is a 69 y.o. male patient admitted from ED awake, alert - oriented  X 3- 4 - no acute distress noted.  VSS - Blood pressure (!) 145/88, pulse 61, temperature 98.8 F (37.1 C), temperature source Oral, resp. rate 17, height 5\' 7"  (1.702 m), weight (!) 104 kg, SpO2 90 %.    IV in place, occlusive dsg intact without redness.  Orientation to room, and floor completed with information packet given to patient/family.  Patient declined safety video at this time.  Admission INP armband ID verified with patient/family, and in place.   SR up x 2, fall assessment complete, with patient and family able to verbalize understanding of risk associated with falls, and verbalized understanding to call nsg before up out of bed.  Call light within reach, patient able to voice, and demonstrate understanding.  Skin, clean-dry- intact without evidence of bruising, or skin tears.   No evidence of skin break down noted on exam.     Will cont to eval and treat per MD orders.  , RN 07/07/2020 4:19 PM

## 2020-07-07 NOTE — Progress Notes (Addendum)
Patient desating in low 50s. Patient unable to follow commands. Oxygen increased to 50 liters via heated high flow and 25 liters via non-breather saturation slowly improving. RRT team and physician at the bedside to assess. Patient slowly able to follow commands and answer orientation questions. Pupil reactive and brisk. Patient currently on 30 liters of oxygen via heated high flow sating above 92%. Patient saturation goal is 85 percent or higher at rest. Patient is resting, no distress at this time.

## 2020-07-07 NOTE — Progress Notes (Signed)
Unable to complete admission questions due to patient unable to give history at this time.

## 2020-07-07 NOTE — Progress Notes (Signed)
Patient agitated about plan of care. Paranoid that the medications we are giving him will not work, and are "useless". MD, RT and RN to room. Patient states he "is fine" while on 35L 100% heated hiflo nasal cannula. He is worried about his wife at home alone states that "my wife is dead". Patient daughter called to check on wife's status. Daughter states she is [place] and has been checking on wife, but unable to get a video call. Patient's other family will be going by later this morning and will set up a video call for patient and wife at that point to ensure wellness of wife.

## 2020-07-07 NOTE — Progress Notes (Signed)
PROGRESS NOTE                                                                                                                                                                                                             Patient Demographics:    Gregory [name], is a 69 y.o. male, DOB - 1951-05-02, ZOX:[id number]  Admit date - 06/28/2020   Admitting Physician Shirlean Mylar, MD  Outpatient Primary MD for the patient is [facility]  LOS - 4   Chief Complaint  Patient presents with  . Respiratory Distress       Brief Narrative    69 yo BM presented to ED on 06/09/2020 with hypoxia on NRB to 15L and confusion, unable to give history. Code sepsis initiated, s/p 2.25L NS fluid resuscitation. Admitted to ICU.Patient brought to ED after daughter (who lives out of state) called EMS because her father sounded confused on the phone. Per EDP's note, patient reportedly had SpO2 in 40s at home. He was placed on NRB and transported to [facility]. Here, patient is oriented to self only. He cannot answer questions about his medical history. In ED, patient on 15L NRB satting in upper 80s low 90s, still confused. Febrile to 101.8*F, tachypneic to 20s-30s. Arterial O2 low to 68, cr elevated to 3.81, elevated LFTs 205/86 AST/ALT, no leukocytosis or ANC elevation, procal elevated to 1.79.  Need to confirm acute hypoxic respiratory failure due to COVID-19 pneumonia.   Subjective:    Gregory [name] today is paranoid, mildly confused, he denies any dyspnea.    Assessment  & Plan :    Active Problems:   Sepsis (HCC)   Hypoxia   Shortness of breath  Acute hypoxic respiratory failure due to COVID-19 pneumonia -Patient with significant oxygen requirement on presentation, this has been improving slowly, this morning he is on heated high flow 25 L at 70%. -Continue with IV steroids, I have increased his Decadron to 10 mg IV daily -Continue with IV Decadron. -Received  Actemra 7/28 -Continue to monitor inflammatory markers closely, CRP trending down, please see discussion below regarding elevated D-dimers. -Diuresis as needed, will give 40 mg of IV Lasix today.Marland Kitchen  SpO2: 92 % O2 Flow Rate (L/min): (S) 25 L/min FiO2 (%): (S) 70 %     COVID-19 Labs  Recent Labs    07/05/20 [phone]  07/06/20 0846 07/07/20 0726  DDIMER 6.50* >20.00* >20.00*  FERRITIN 5,962* 4,320* 2,833*  CRP 9.5* 5.2* 3.5*    Lab Results  Component Value Date   SARSCOV2NAA POSITIVE (A) 2020-07-27    Renal insufficiency, most likely acute on chronic, unclear baseline. -Improving gradually, renal input greatly appreciated, nonoliguric, most likely related to COVID-19, in the setting of diuretic/ARB use.  Acute DVT -Significantly elevated today >20, venous Doppler significant for bilateral DVT. -Patient started on Eliquis.  Paranoia -Patient is awake alert x3, but he has a poor knowledge and understanding of his current disease, how acutely ill is he, he is having trust issues with healthcare system altogether, he think he was kidnapped by EMS when his daughter called ambulance to bring him to the hospital, he does not acknowledge his current limitations including hypoxia, shortness of breath, he has been refusing his medications, had multiple discussion with the patient, as well family did the same to try to reassure him we are doing what is the best for him, but despite that he continues to refuse treatments frequently.  Hypertension -Blood pressure is acceptable, continue to monitor on as needed hydralazine  Diabetes mellitus -With A1c of 11.3 -Poorly controlled, started on 6 units NovoLog before meals, increase Levemir to 20 units twice daily, change sliding scale to resistant.   Code Status : Full  Family Communication  : Daughter updated by phone daily.  Disposition Plan  :  Status is: Inpatient  Remains inpatient appropriate because:IV treatments appropriate due to  intensity of illness or inability to take PO   Dispo: The patient is from: Home              Anticipated d/c is to: Home              Anticipated d/c date is: > 3 days              Patient currently is not medically stable to d/c.     Consults  :  PCCM  Procedures  : None  DVT Prophylaxis  :  Sadler heparin>> Eliquis  Lab Results  Component Value Date   PLT 132 (L) 07/07/2020    Antibiotics  :    Anti-infectives (From admission, onward)   Start     Dose/Rate Route Frequency Ordered Stop   07/04/20 1000  remdesivir 100 mg in sodium chloride 0.9 % 100 mL IVPB     Discontinue    "Followed by" Linked Group Details   100 mg 200 mL/hr over 30 Minutes Intravenous Daily 07-27-20 1953 07/08/20 0959   2020/07/27 2000  remdesivir 200 mg in sodium chloride 0.9% 250 mL IVPB       "Followed by" Linked Group Details   200 mg 580 mL/hr over 30 Minutes Intravenous Once 07/27/2020 1953 July 27, 2020 2107   27-Jul-2020 1445  vancomycin (VANCOREADY) IVPB 2000 mg/400 mL        2,000 mg 200 mL/hr over 120 Minutes Intravenous  Once 27-Jul-2020 1431 2020/07/27 1827   07-27-2020 1415  ceFEPIme (MAXIPIME) 2 g in sodium chloride 0.9 % 100 mL IVPB        2 g 200 mL/hr over 30 Minutes Intravenous  Once 07/27/20 1411 2020-07-27 1530   Jul 27, 2020 1415  metroNIDAZOLE (FLAGYL) IVPB 500 mg        500 mg 100 mL/hr over 60 Minutes Intravenous  Once 07/27/20 1411 July 27, 2020 1601   Jul 27, 2020 1415  vancomycin (VANCOCIN) IVPB 1000 mg/200 mL premix  Status:  Discontinued  1,000 mg 200 mL/hr over 60 Minutes Intravenous  Once 07/04/2020 1411 06/26/2020 1601        Objective:   Vitals:   07/07/20 0738 07/07/20 0819 07/07/20 0902 07/07/20 0932  BP: (!) 138/92  (!) 146/84   Pulse: 58 58  60  Resp: 15 23  23   Temp:      TempSrc:      SpO2: 95% 91%  92%  Weight:      Height:        Wt Readings from Last 3 Encounters:  07/07/20 (!) 104 kg     Intake/Output Summary (Last 24 hours) at 07/07/2020 1109 Last data filed at 07/07/2020  0800 Gross per 24 hour  Intake 960 ml  Output 2200 ml  Net -1240 ml     Physical Exam  Awake Alert, Oriented X 3, even though he is oriented x3, he has impaired insight into his disease and surrounding, and treatment,  Symmetrical Chest wall movement, Good air movement bilaterally, CTAB RRR,No Gallops,Rubs or new Murmurs, No Parasternal Heave +ve B.Sounds, Abd Soft, No tenderness, No rebound - guarding or rigidity. No Cyanosis, Clubbing , trace  edema, No new Rash or bruise        Data Review:    CBC Recent Labs  Lab 06/10/2020 1415 06/13/2020 1415 06/11/2020 1535 07/04/20 0720 07/05/20 0711 07/06/20 0846 07/07/20 0726  WBC 4.9  --   --  3.3* 7.1 10.4 14.4*  HGB 11.3*   < > 11.9* 10.6* 11.0* 12.0* 12.2*  HCT 34.8*   < > 35.0* 33.1* 34.4* 36.6* 37.5*  PLT 136*  --   --  150 189 186 132*  MCV 89.2  --   --  91.2 89.8 88.4 87.8  MCH 29.0  --   --  29.2 28.7 29.0 28.6  MCHC 32.5  --   --  32.0 32.0 32.8 32.5  RDW 12.3  --   --  12.4 12.5 12.7 13.0  LYMPHSABS 1.0  --   --  0.3* 0.9 1.1 1.9  MONOABS 0.5  --   --  0.0* 0.5 0.5 0.7  EOSABS 0.0  --   --  0.0 0.0 0.0 0.0  BASOSABS 0.0  --   --  0.0 0.0 0.0 0.0   < > = values in this interval not displayed.    Chemistries  Recent Labs  Lab 06/28/2020 1415 06/25/2020 1535 07/04/20 0720 07/04/20 1148 07/04/20 1801 07/05/20 0711 07/05/20 1420 07/06/20 0846 07/07/20 0726  NA 138   < > 144  --  143 142 141 143 142  K 4.5   < > 5.2*  --  4.6 5.4* 4.9 4.8 4.8  CL 107   < > 114*  --  112* 112* 112* 113* 112*  CO2 20*   < > 20*  --  20* 19* 18* 20* 21*  GLUCOSE 322*   < > 317*  --  314* 337* 365* 305* 249*  BUN 51*   < > 59*  --  61* 62* 66* 73* 87*  CREATININE 3.81*   < > 3.72*  --  3.44* 2.99* 3.17* 3.07* 3.11*  CALCIUM 8.2*   < > 7.7*   < > 8.1* 8.0* 8.0* 8.2* 8.0*  MG  --   --  2.2  --   --  2.4  --  2.6* 2.6*  AST 205*  --  231*  --   --  101*  --  54* 50*  ALT 86*  --  113*  --   --  86*  --  72* 60*  ALKPHOS 40  --  40   --   --  50  --  70 87  BILITOT 0.6  --  0.6  --   --  0.4  --  0.7 0.8   < > = values in this interval not displayed.   ------------------------------------------------------------------------------------------------------------------ No results for input(s): CHOL, HDL, LDLCALC, TRIG, CHOLHDL, LDLDIRECT in the last 72 hours.  Lab Results  Component Value Date   HGBA1C 11.3 (H) 07/04/2020   ------------------------------------------------------------------------------------------------------------------ No results for input(s): TSH, T4TOTAL, T3FREE, THYROIDAB in the last 72 hours.  Invalid input(s): FREET3 ------------------------------------------------------------------------------------------------------------------ Recent Labs    07/06/20 0846 07/07/20 0726  FERRITIN 4,320* 2,833*    Coagulation profile Recent Labs  Lab 07/02/2020 1415  INR 1.0    Recent Labs    07/06/20 0846 07/07/20 0726  DDIMER >20.00* >20.00*    Cardiac Enzymes No results for input(s): CKMB, TROPONINI, MYOGLOBIN in the last 168 hours.  Invalid input(s): CK ------------------------------------------------------------------------------------------------------------------    Component Value Date/Time   BNP 78.5 06/25/2020 1415    Inpatient Medications  Scheduled Meds: . apixaban  5 mg Oral BID  . chlorhexidine  15 mL Mouth Rinse BID  . Chlorhexidine Gluconate Cloth  6 each Topical Q0600  . dexamethasone (DECADRON) injection  10 mg Intravenous Q24H  . docusate sodium  100 mg Oral BID  . insulin aspart  0-20 Units Subcutaneous TID WC  . insulin aspart  0-5 Units Subcutaneous QHS  . insulin aspart  6 Units Subcutaneous TID WC  . insulin detemir  20 Units Subcutaneous BID  . mouth rinse  15 mL Mouth Rinse q12n4p  . pantoprazole  40 mg Oral Daily  . sodium chloride flush  10-40 mL Intracatheter Q12H  . sodium chloride flush  3 mL Intravenous Q12H   Continuous Infusions: . remdesivir 100  mg in NS 100 mL Stopped (07/06/20 1113)   PRN Meds:.acetaminophen, cloNIDine, haloperidol lactate, pneumococcal 23 valent vaccine, polyethylene glycol, sodium chloride flush  Micro Results Recent Results (from the past 240 hour(s))  Blood Culture (routine x 2)     Status: None (Preliminary result)   Collection Time: 06/26/2020  2:10 PM   Specimen: BLOOD  Result Value Ref Range Status   Specimen Description BLOOD RIGHT ANTECUBITAL  Final   Special Requests   Final    BOTTLES DRAWN AEROBIC AND ANAEROBIC Blood Culture results may not be optimal due to an inadequate volume of blood received in culture bottles   Culture   Final    NO GROWTH 4 DAYS Performed at [facility], [address], [place], [place] [address]    Report Status PENDING  Incomplete  Blood Culture (routine x 2)     Status: None (Preliminary result)   Collection Time: 07/02/2020  2:38 PM   Specimen: BLOOD  Result Value Ref Range Status   Specimen Description BLOOD LEFT ANTECUBITAL  Final   Special Requests   Final    BOTTLES DRAWN AEROBIC ONLY Blood Culture results may not be optimal due to an inadequate volume of blood received in culture bottles   Culture   Final    NO GROWTH 4 DAYS Performed at [facility], [address], [place], [place] [address]    Report Status PENDING  Incomplete  SARS Coronavirus 2 by RT PCR (hospital order, performed in [facility] hospital lab) Nasopharyngeal Nasopharyngeal Swab  Status: Abnormal   Collection Time: 07-06-20  2:47 PM   Specimen: Nasopharyngeal Swab  Result Value Ref Range Status   SARS Coronavirus 2 POSITIVE (A) NEGATIVE Final    Comment: RESULT CALLED TO, READ BACK BY AND VERIFIED WITH: Della Goo MD @1954  Jul 06, 2020 EB K,SPRANGERS RN @1957  07-06-2020 EB (NOTE) SARS-CoV-2 target nucleic acids are DETECTED  SARS-CoV-2 RNA is generally detectable in upper respiratory specimens  during the acute phase of infection.  Positive results are indicative  of the  presence of the identified virus, but do not rule out bacterial infection or co-infection with other pathogens not detected by the test.  Clinical correlation with patient history and  other diagnostic information is necessary to determine patient infection status.  The expected result is negative.  Fact Sheet for Patients:   BoilerBrush.com.cy   Fact Sheet for Healthcare Providers:   https://pope.com/    This test is not yet approved or cleared by the [place] FDA and  has been authorized for detection and/or diagnosis of SARS-CoV-2 by FDA under an Emergency Use Authorization (EUA).  This EUA will  remain in effect (meaning this test can be used) for the duration of  the COVID-19 declaration under Section 564(b)(1) of the Act, 21 U.S.C. section 360-bbb-3(b)(1), unless the authorization is terminated or revoked sooner.  Performed at [facility], [address], [place], [place] [address]   Urine culture     Status: Abnormal   Collection Time: Jul 06, 2020  4:15 PM   Specimen: In/Out Cath Urine  Result Value Ref Range Status   Specimen Description IN/OUT CATH URINE  Final   Special Requests NONE  Final   Culture (A)  Final    <10,000 COLONIES/mL INSIGNIFICANT GROWTH Performed at [facility], [address] [place], [place] [address]    Report Status 07/04/2020 FINAL  Final  MRSA PCR Screening     Status: None   Collection Time: 07/04/20  4:07 AM   Specimen: Nasal Mucosa; Nasopharyngeal  Result Value Ref Range Status   MRSA by PCR NEGATIVE NEGATIVE Final    Comment:        The GeneXpert MRSA Assay (FDA approved for NASAL specimens only), is one component of a comprehensive MRSA colonization surveillance program. It is not intended to diagnose MRSA infection nor to guide or monitor treatment for MRSA infections. Performed at [facility], [address] [place], [place] [address]     Radiology  Reports US RENAL  Result Date: 07-06-20 CLINICAL DATA:  Acute renal failure EXAM: RENAL / URINARY TRACT ULTRASOUND COMPLETE COMPARISON:  None. FINDINGS: Right Kidney: Renal measurements: 10.2 x 4.5 x 4.3 cm = volume: 103 mL. Mildly increased echogenicity seen within the renal parenchyma. No mass or hydronephrosis visualized. Left Kidney: Renal measurements: 11.9 x 5.8 x 4.0 cm = volume: 157 mL. Mildly increased echogenicity seen throughout the renal parenchyma. No mass or hydronephrosis visualized. Bladder: Appears normal for degree of bladder distention. Other: None. IMPRESSION: Diffusely increased parenchymal echogenicity, consistent with medical renal disease. No other acute renal abnormality Electronically Signed   By: Jonna Clark M.D.   On: 2020-07-06 21:21   Portable chest 1 View  Result Date: 07/04/2020 CLINICAL DATA:  Shortness of breath.  COVID-19 positive. EXAM: PORTABLE CHEST 1 VIEW COMPARISON:  07-06-2020 FINDINGS: Widespread airspace opacity is noted bilaterally, increased from 1 day prior. No frank consolidation. Heart is upper normal in size with pulmonary vascularity normal. No adenopathy. No  bone lesions. IMPRESSION: Widespread airspace opacity, overall progressed from 1 day prior, most likely due to widespread atypical organism pneumonia given the history. No consolidation. Stable cardiac silhouette. Electronically Signed   By: Bretta Bang III M.D.   On: 07/04/2020 07:56   DG Chest Port 1 View  Result Date: 06/25/2020 CLINICAL DATA:  Sepsis, short of breath EXAM: PORTABLE CHEST 1 VIEW COMPARISON:  None. FINDINGS: Moderately severe diffuse bilateral airspace disease left greater than right. No pleural effusion. Heart size upper normal. IMPRESSION: Diffuse bilateral airspace disease. Possible pneumonia or edema. Rule out COVID. Electronically Signed   By: Marlan Palau M.D.   On: 06/17/2020 14:40   ECHOCARDIOGRAM COMPLETE  Result Date: 07/04/2020    ECHOCARDIOGRAM REPORT    Patient Name:   DRELYN PISTILLI Date of Exam: 07/04/2020 Medical Rec #:  [id number]       Height:       67.0 in Accession #:    [id number]      Weight:       233.2 lb Date of Birth:  1951/01/22        BSA:          2.159 m Patient Age:    68 years        BP:           131/80 mmHg Patient Gender: M               HR:           53 bpm. Exam Location:  Inpatient Procedure: 2D Echo Indications:    sepsis  History:        Patient has no prior history of Echocardiogram examinations.                 Covid +. no prior cardiac hx listed on file.  Sonographer:    Celene Skeen RDCS (AE) Referring Phys: [id number] MATTHEW J TRIFAN IMPRESSIONS  1. Left ventricular ejection fraction, by estimation, is 55 to 60%. The left ventricle has normal function. The left ventricle has no regional wall motion abnormalities. There is moderate left ventricular hypertrophy. Left ventricular diastolic parameters are indeterminate.  2. Right ventricular systolic function is normal. The right ventricular size is mildly enlarged. Tricuspid regurgitation signal is inadequate for assessing PA pressure.  3. The mitral valve is normal in structure. No evidence of mitral valve regurgitation.  4. The aortic valve was not well visualized. Aortic valve regurgitation is not visualized. No aortic stenosis is present.  5. The inferior vena cava is dilated in size with >50% respiratory variability, suggesting right atrial pressure of 8 mmHg. FINDINGS  Left Ventricle: Left ventricular ejection fraction, by estimation, is 55 to 60%. The left ventricle has normal function. The left ventricle has no regional wall motion abnormalities. The left ventricular internal cavity size was normal in size. There is  moderate left ventricular hypertrophy. Left ventricular diastolic parameters are indeterminate. Right Ventricle: The right ventricular size is mildly enlarged. Right vetricular wall thickness was not assessed. Right ventricular systolic function is normal. Tricuspid  regurgitation signal is inadequate for assessing PA pressure. Left Atrium: Left atrial size was normal in size. Right Atrium: Right atrial size was not well visualized. Pericardium: There is no evidence of pericardial effusion. Mitral Valve: The mitral valve is normal in structure. No evidence of mitral valve regurgitation. Tricuspid Valve: The tricuspid valve is grossly normal. Tricuspid valve regurgitation is not demonstrated. Aortic Valve: The aortic valve was not well visualized. Aortic valve regurgitation  is not visualized. No aortic stenosis is present. Pulmonic Valve: The pulmonic valve was not well visualized. Pulmonic valve regurgitation is not visualized. Aorta: The aortic root is normal in size and structure. Venous: The inferior vena cava is dilated in size with greater than 50% respiratory variability, suggesting right atrial pressure of 8 mmHg. IAS/Shunts: The interatrial septum was not well visualized.  LEFT VENTRICLE PLAX 2D LVIDd:         4.66 cm LVIDs:         3.33 cm LV PW:         1.00 cm LV IVS:        1.19 cm LVOT diam:     2.10 cm LV SV:         47 LV SV Index:   22 LVOT Area:     3.46 cm  LEFT ATRIUM           Index LA diam:      3.40 cm 1.57 cm/m LA Vol (A2C): 47.4 ml 21.96 ml/m  AORTIC VALVE LVOT Vmax:   70.50 cm/s LVOT Vmean:  53.500 cm/s LVOT VTI:    0.135 m  AORTA Ao Root diam: 3.10 cm MITRAL VALVE MV Area (PHT): 4.49 cm    SHUNTS MV Decel Time: 169 msec    Systemic VTI:  0.14 m MV E velocity: 74.60 cm/s  Systemic Diam: 2.10 cm MV A velocity: 72.40 cm/s MV E/A ratio:  1.03 Epifanio Lesches MD Electronically signed by Epifanio Lesches MD Signature Date/Time: 07/04/2020/12:54:11 PM    Final    VAS [place] LOWER EXTREMITY VENOUS (DVT)  Result Date: 07/07/2020  Lower Venous DVTStudy Indications: Covid-19, elevated D-Dimer.  Comparison Study: No prior study on file Performing Technologist: Sherren Kerns RVS  Examination Guidelines: A complete evaluation includes B-mode imaging,  spectral Doppler, color Doppler, and power Doppler as needed of all accessible portions of each vessel. Bilateral testing is considered an integral part of a complete examination. Limited examinations for reoccurring indications may be performed as noted. The reflux portion of the exam is performed with the patient in reverse Trendelenburg.  +---------+---------------+---------+-----------+----------+--------------+ RIGHT    CompressibilityPhasicitySpontaneityPropertiesThrombus Aging +---------+---------------+---------+-----------+----------+--------------+ CFV      Full           Yes      Yes                                 +---------+---------------+---------+-----------+----------+--------------+ SFJ      Full                                                        +---------+---------------+---------+-----------+----------+--------------+ FV Prox  Full                                                        +---------+---------------+---------+-----------+----------+--------------+ FV Mid   Full                                                        +---------+---------------+---------+-----------+----------+--------------+  FV DistalFull                                                        +---------+---------------+---------+-----------+----------+--------------+ PFV      Full                                                        +---------+---------------+---------+-----------+----------+--------------+ POP      None           No       No                   Acute          +---------+---------------+---------+-----------+----------+--------------+ PTV      None                                         Acute          +---------+---------------+---------+-----------+----------+--------------+ PERO     None                                         Acute          +---------+---------------+---------+-----------+----------+--------------+ Gastroc   Full                                                        +---------+---------------+---------+-----------+----------+--------------+   +---------+---------------+---------+-----------+----------+--------------+ LEFT     CompressibilityPhasicitySpontaneityPropertiesThrombus Aging +---------+---------------+---------+-----------+----------+--------------+ CFV      Full           Yes      Yes                                 +---------+---------------+---------+-----------+----------+--------------+ SFJ      Full                                                        +---------+---------------+---------+-----------+----------+--------------+ FV Prox  Full                                                        +---------+---------------+---------+-----------+----------+--------------+ FV Mid   Full                                                        +---------+---------------+---------+-----------+----------+--------------+  FV DistalFull                                                        +---------+---------------+---------+-----------+----------+--------------+ PFV      Full                                                        +---------+---------------+---------+-----------+----------+--------------+ POP      Partial        No       No                   Acute          +---------+---------------+---------+-----------+----------+--------------+ PTV      None                                         Acute          +---------+---------------+---------+-----------+----------+--------------+ PERO     None                                         Acute          +---------+---------------+---------+-----------+----------+--------------+ Gastroc  Full                                                        +---------+---------------+---------+-----------+----------+--------------+     Summary: RIGHT: - Findings consistent with acute deep  vein thrombosis involving the right popliteal vein, right posterior tibial veins, and right peroneal veins.  LEFT: - Findings consistent with acute deep vein thrombosis involving the left popliteal vein, left posterior tibial veins, and left peroneal veins.  *See table(s) above for measurements and observations. Electronically signed by Waverly Ferrari MD on 07/07/2020 at 6:52:49 AM.    Final      Huey Bienenstock M.D on 07/07/2020 at 11:09 AM    [facility] -  Office  [phone]

## 2020-07-07 NOTE — Progress Notes (Signed)
Patients daughter called to discuss possible IV blood thinners. States just wanted to let providers know that maybe sedation would be useful if that is an option and not restraints . States that patient already distrustful and feels like it would be counter-productive. Daughter expressed concerns. Daughter given an update via phone of patient status.   Patients still refuses PO medication.

## 2020-07-07 NOTE — Progress Notes (Signed)
Daughter updated via phone on plan of care and patient status. Notified that the patient still has not taken PO blood thinners and will likely be started on IV blood thinners due to refusal.

## 2020-07-07 DEATH — deceased

## 2020-07-08 DIAGNOSIS — N289 Disorder of kidney and ureter, unspecified: Secondary | ICD-10-CM

## 2020-07-08 LAB — PHOSPHORUS: Phosphorus: 4.8 mg/dL — ABNORMAL HIGH (ref 2.5–4.6)

## 2020-07-08 LAB — GLUCOSE, CAPILLARY
Glucose-Capillary: 402 mg/dL — ABNORMAL HIGH (ref 70–99)
Glucose-Capillary: 343 mg/dL — ABNORMAL HIGH (ref 70–99)
Glucose-Capillary: 264 mg/dL — ABNORMAL HIGH (ref 70–99)
Glucose-Capillary: 135 mg/dL — ABNORMAL HIGH (ref 70–99)

## 2020-07-08 LAB — COMPREHENSIVE METABOLIC PANEL
ALT: 45 U/L — ABNORMAL HIGH (ref 0–44)
AST: 33 U/L (ref 15–41)
Albumin: 2.1 g/dL — ABNORMAL LOW (ref 3.5–5.0)
Alkaline Phosphatase: 95 U/L (ref 38–126)
Anion gap: 10 (ref 5–15)
BUN: 87 mg/dL — ABNORMAL HIGH (ref 8–23)
CO2: 19 mmol/L — ABNORMAL LOW (ref 22–32)
Calcium: 7.8 mg/dL — ABNORMAL LOW (ref 8.9–10.3)
Chloride: 111 mmol/L (ref 98–111)
Creatinine, Ser: 2.82 mg/dL — ABNORMAL HIGH (ref 0.61–1.24)
GFR calc Af Amer: 26 mL/min — ABNORMAL LOW (ref >60)
GFR calc non Af Amer: 22 mL/min — ABNORMAL LOW (ref >60)
Glucose, Bld: 391 mg/dL — ABNORMAL HIGH (ref 70–99)
Potassium: 5.4 mmol/L — ABNORMAL HIGH (ref 3.5–5.1)
Sodium: 140 mmol/L (ref 135–145)
Total Bilirubin: 0.8 mg/dL (ref 0.3–1.2)
Total Protein: 5 g/dL — ABNORMAL LOW (ref 6.5–8.1)

## 2020-07-08 LAB — CULTURE, BLOOD (ROUTINE X 2)
Culture: NO GROWTH
Culture: NO GROWTH

## 2020-07-08 LAB — CBC WITH DIFFERENTIAL/PLATELET
Abs Immature Granulocytes: 0.49 10*3/uL — ABNORMAL HIGH (ref 0.00–0.07)
Basophils Absolute: 0 10*3/uL (ref 0.0–0.1)
Basophils Relative: 0 %
Eosinophils Absolute: 0 10*3/uL (ref 0.0–0.5)
Eosinophils Relative: 0 %
HCT: 37.6 % — ABNORMAL LOW (ref 39.0–52.0)
Hemoglobin: 12.3 g/dL — ABNORMAL LOW (ref 13.0–17.0)
Immature Granulocytes: 3 %
Lymphocytes Relative: 8 %
Lymphs Abs: 1.2 10*3/uL (ref 0.7–4.0)
MCH: 29.1 pg (ref 26.0–34.0)
MCHC: 32.7 g/dL (ref 30.0–36.0)
MCV: 89.1 fL (ref 80.0–100.0)
Monocytes Absolute: 0.4 10*3/uL (ref 0.1–1.0)
Monocytes Relative: 2 %
Neutro Abs: 13.7 10*3/uL — ABNORMAL HIGH (ref 1.7–7.7)
Neutrophils Relative %: 87 %
Platelets: 126 10*3/uL — ABNORMAL LOW (ref 150–400)
RBC: 4.22 MIL/uL (ref 4.22–5.81)
RDW: 13.2 % (ref 11.5–15.5)
Smear Review: ADEQUATE
WBC: 15.9 10*3/uL — ABNORMAL HIGH (ref 4.0–10.5)
nRBC: 0.4 % — ABNORMAL HIGH (ref 0.0–0.2)

## 2020-07-08 LAB — FERRITIN: Ferritin: 2174 ng/mL — ABNORMAL HIGH (ref 24–336)

## 2020-07-08 LAB — MAGNESIUM: Magnesium: 2.6 mg/dL — ABNORMAL HIGH (ref 1.7–2.4)

## 2020-07-08 LAB — C-REACTIVE PROTEIN: CRP: 1.6 mg/dL — ABNORMAL HIGH (ref <1.0)

## 2020-07-08 LAB — D-DIMER, QUANTITATIVE: D-Dimer, Quant: >20 ug/mL-FEU — ABNORMAL HIGH (ref 0.00–0.50)

## 2020-07-08 MED ORDER — HALOPERIDOL LACTATE 5 MG/ML IJ SOLN
2.0000 mg | Freq: Once | INTRAMUSCULAR | Status: AC
  Administered 2020-07-08: 2 mg via INTRAVENOUS
  Filled 2020-07-08: qty 1

## 2020-07-08 MED ORDER — SODIUM ZIRCONIUM CYCLOSILICATE 10 G PO PACK
10.0000 g | PACK | Freq: Two times a day (BID) | ORAL | Status: AC
  Administered 2020-07-08 (×2): 10 g via ORAL
  Filled 2020-07-08 (×2): qty 1

## 2020-07-08 MED ORDER — HALOPERIDOL LACTATE 5 MG/ML IJ SOLN
1.0000 mg | Freq: Two times a day (BID) | INTRAMUSCULAR | Status: DC | PRN
  Administered 2020-07-08 – 2020-07-09 (×2): 1 mg via INTRAVENOUS
  Filled 2020-07-08 (×2): qty 1

## 2020-07-08 NOTE — Progress Notes (Signed)
PROGRESS NOTE                                                                                                                                                                                                             Patient Demographics:    Gregory [name], is a 69 y.o. male, DOB - [birth year], YNW:[id number]  Admit date - 2020/07/15   Admitting Physician Shirlean Mylar, MD  Outpatient Primary MD for the patient is [facility]  LOS - 5   Chief Complaint  Patient presents with  . Respiratory Distress       Brief Narrative    69 yo BM presented to ED on 07/15/20 with hypoxia on NRB to 15L and confusion, unable to give history. Code sepsis initiated, s/p 2.25L NS fluid resuscitation. Admitted to ICU.Patient brought to ED after daughter (who lives out of state) called EMS because her father sounded confused on the phone. Per EDP's note, patient reportedly had SpO2 in 40s at home. He was placed on NRB and transported to [facility]. Here, patient is oriented to self only. He cannot answer questions about his medical history. In ED, patient on 15L NRB satting in upper 80s low 90s, still confused. Febrile to 101.8*F, tachypneic to 20s-30s. Arterial O2 low to 68, cr elevated to 3.81, elevated LFTs 205/86 AST/ALT, no leukocytosis or ANC elevation, procal elevated to 1.79.  Need to confirm acute hypoxic respiratory failure due to COVID-19 pneumonia.   Subjective:    [name] with evidence of hypoxia overnight when he had rapid response done, he has stabilized after that, otherwise no significant events overnight as discussed with staff beside him declining to take his meds.    Assessment  & Plan :    Active Problems:   Sepsis (HCC)   Hypoxia   Shortness of breath   Acute respiratory disease due to COVID-19 virus  Acute hypoxic respiratory failure due to COVID-19 pneumonia -Patient with significant oxygen requirement on presentation, he  remains with significant hypoxia and oxygen requirement, he is on 30 L, 90% heated high flow. -Continue with IV steroids, I have increased his Decadron to 10 mg IV daily -Continue with IV Decadron. -Received Actemra 7/28 -Diuresis as needed, no indication for Lasix today -Continue to monitor inflammatory markers closely  SpO2: 93 % O2 Flow Rate (L/min): 30 L/min FiO2 (%):  90 %     COVID-19 Labs  Recent Labs    07/06/20 0846 07/07/20 0726 07/08/20 0419  DDIMER >20.00* >20.00* >20.00*  FERRITIN 4,320* 2,833* 2,174*  CRP 5.2* 3.5* 1.6*    Lab Results  Component Value Date   SARSCOV2NAA POSITIVE (A) 07/05/2020    Renal insufficiency, most likely acute on chronic, unclear baseline. -[facility] of his baseline creatinine. -Improving gradually, renal input greatly appreciated, nonoliguric, most likely related to COVID-19, in the setting of diuretic/ARB use.  Acute DVT -Significantly elevated today >20, venous Doppler significant for bilateral DVT. -Patient started on Eliquis.  Paranoia -Patient significantly paranoid, noncompliant with medication, argumentative with staff, he is clearly have significant lack of understanding of his disease, and no understanding of the treatment process, refused most of his meds yesterday, morning he was started on as needed Haldol, he did take his meds this morning, will keep on as needed Haldol.  Have discussed at length with his daughter, and she is agreeable on current plan.   Hypertension -Blood pressure is acceptable, continue to monitor on as needed hydralazine  Diabetes mellitus -With A1c of 11.3 -Poorly controlled, specially he did refuse his Levemir yesterday, so we will hold on increasing his insulin despite elevated CBGs as he did not receive any Levemir yesterday, will continue with current regimen .   Code Status : Full  Family Communication  : Daughter updated by phone  daily.  Disposition Plan  :  Status is: Inpatient  Remains inpatient appropriate because:IV treatments appropriate due to intensity of illness or inability to take PO   Dispo: The patient is from: Home              Anticipated d/c is to: Home              Anticipated d/c date is: > 3 days              Patient currently is not medically stable to d/c.     Consults  :  PCCM  Procedures  : None  DVT Prophylaxis  :   heparin>> Eliquis  Lab Results  Component Value Date   PLT 126 (L) 07/08/2020    Antibiotics  :    Anti-infectives (From admission, onward)   Start     Dose/Rate Route Frequency Ordered Stop   07/04/20 1000  remdesivir 100 mg in sodium chloride 0.9 % 100 mL IVPB       "Followed by" Linked Group Details   100 mg 200 mL/hr over 30 Minutes Intravenous Daily 06/16/2020 1953 07/08/20 0954   06/15/2020 2000  remdesivir 200 mg in sodium chloride 0.9% 250 mL IVPB       "Followed by" Linked Group Details   200 mg 580 mL/hr over 30 Minutes Intravenous Once 06/26/2020 1953 07/04/2020 2107   06/20/2020 1445  vancomycin (VANCOREADY) IVPB 2000 mg/400 mL        2,000 mg 200 mL/hr over 120 Minutes Intravenous  Once 06/18/2020 1431 06/13/2020 1827   07/02/2020 1415  ceFEPIme (MAXIPIME) 2 g in sodium chloride 0.9 % 100 mL IVPB        2 g 200 mL/hr over 30 Minutes Intravenous  Once 07/04/2020 1411 06/09/2020 1530   06/10/2020 1415  metroNIDAZOLE (FLAGYL) IVPB 500 mg        500 mg 100 mL/hr over 60 Minutes Intravenous  Once 06/23/2020 1411 06/16/2020 1601   06/20/2020 1415  vancomycin (VANCOCIN) IVPB 1000  mg/200 mL premix  Status:  Discontinued        1,000 mg 200 mL/hr over 60 Minutes Intravenous  Once 2020/07/18 1411 2020-07-18 1601        Objective:   Vitals:   07/08/20 0722 07/08/20 0900 07/08/20 0933 07/08/20 1141  BP: 117/67   131/75  Pulse: 64 67 68 68  Resp: (!) 21  Temp: 98.6 F (37 C)   98 F (36.7 C)  TempSrc: Axillary   Axillary  SpO2: 91% 96% 96% 93%  Weight:       Height:        Wt Readings from Last 3 Encounters:  07/08/20 (!) 102.4 kg     Intake/Output Summary (Last 24 hours) at 07/08/2020 1224 Last data filed at 07/08/2020 0924 Gross per 24 hour  Intake 720 ml  Output 1950 ml  Net -1230 ml     Physical Exam  Confused, significantly impaired cognition and insight . Symmetrical Chest wall movement, Good air movement bilaterally, CTAB RRR,No Gallops,Rubs or new Murmurs, No Parasternal Heave +ve B.Sounds, Abd Soft, No tenderness, No rebound - guarding or rigidity. No Cyanosis, Clubbing or edema, No new Rash or bruise        Data Review:    CBC Recent Labs  Lab 07/04/20 0720 07/05/20 0711 07/06/20 0846 07/07/20 0726 07/08/20 0419  WBC 3.3* 7.1 10.4 14.4* 15.9*  HGB 10.6* 11.0* 12.0* 12.2* 12.3*  HCT 33.1* 34.4* 36.6* 37.5* 37.6*  PLT 150 189 186 132* 126*  MCV 91.2 89.8 88.4 87.8 89.1  MCH 29.2 28.7 29.0 28.6 29.1  MCHC 32.0 32.0 32.8 32.5 32.7  RDW 12.4 12.5 12.7 13.0 13.2  LYMPHSABS 0.3* 0.9 1.1 1.9 1.2  MONOABS 0.0* 0.5 0.5 0.7 0.4  EOSABS 0.0 0.0 0.0 0.0 0.0  BASOSABS 0.0 0.0 0.0 0.0 0.0    Chemistries  Recent Labs  Lab 07/04/20 0720 07/04/20 1148 07/05/20 0711 07/05/20 1420 07/06/20 0846 07/07/20 0726 07/08/20 0419  NA 144   < > 142 141 143 142 140  K 5.2*   < > 5.4* 4.9 4.8 4.8 5.4*  CL 114*   < > 112* 112* 113* 112* 111  CO2 20*   < > 19* 18* 20* 21* 19*  GLUCOSE 317*   < > 337* 365* 305* 249* 391*  BUN 59*   < > 62* 66* 73* 87* 87*  CREATININE 3.72*   < > 2.99* 3.17* 3.07* 3.11* 2.82*  CALCIUM 7.7*   < > 8.0* 8.0* 8.2* 8.0* 7.8*  MG 2.2  --  2.4  --  2.6* 2.6* 2.6*  AST 231*  --  101*  --  54* 50* 33  ALT 113*  --  86*  --  72* 60* 45*  ALKPHOS 40  --  50  --  70 87 95  BILITOT 0.6  --  0.4  --  0.7 0.8 0.8   < > = values in this interval not displayed.   ------------------------------------------------------------------------------------------------------------------ No results for input(s):  CHOL, HDL, LDLCALC, TRIG, CHOLHDL, LDLDIRECT in the last 72 hours.  Lab Results  Component Value Date   HGBA1C 11.3 (H) 07/04/2020   ------------------------------------------------------------------------------------------------------------------ No results for input(s): TSH, T4TOTAL, T3FREE, THYROIDAB in the last 72 hours.  Invalid input(s): FREET3 ------------------------------------------------------------------------------------------------------------------ Recent Labs    07/07/20 0726 07/08/20 0419  FERRITIN 2,833* 2,174*    Coagulation profile Recent Labs  Lab 07/18/20 1415  INR 1.0    Recent Labs  07/07/20 0726 07/08/20 0419  DDIMER >20.00* >20.00*    Cardiac Enzymes No results for input(s): CKMB, TROPONINI, MYOGLOBIN in the last 168 hours.  Invalid input(s): CK ------------------------------------------------------------------------------------------------------------------    Component Value Date/Time   BNP 78.5 10/19/2020 1415    Inpatient Medications  Scheduled Meds: . apixaban  10 mg Oral BID   Followed by  . [START ON 07/14/2020] apixaban  5 mg Oral BID  . chlorhexidine  15 mL Mouth Rinse BID  . Chlorhexidine Gluconate Cloth  6 each Topical Q0600  . dexamethasone (DECADRON) injection  10 mg Intravenous Q24H  . docusate sodium  100 mg Oral BID  . insulin aspart  0-20 Units Subcutaneous TID WC  . insulin aspart  0-5 Units Subcutaneous QHS  . insulin aspart  6 Units Subcutaneous TID WC  . insulin detemir  20 Units Subcutaneous BID  . mouth rinse  15 mL Mouth Rinse q12n4p  . pantoprazole  40 mg Oral Daily  . sodium chloride flush  10-40 mL Intracatheter Q12H  . sodium chloride flush  3 mL Intravenous Q12H  . sodium zirconium cyclosilicate  10 g Oral BID   Continuous Infusions:  PRN Meds:.acetaminophen, cloNIDine, haloperidol lactate, pneumococcal 23 valent vaccine, polyethylene glycol, sodium chloride flush  Micro Results Recent Results  (from the past 240 hour(s))  Blood Culture (routine x 2)     Status: None   Collection Time: June 27, 2020  2:10 PM   Specimen: BLOOD  Result Value Ref Range Status   Specimen Description BLOOD RIGHT ANTECUBITAL  Final   Special Requests   Final    BOTTLES DRAWN AEROBIC AND ANAEROBIC Blood Culture results may not be optimal due to an inadequate volume of blood received in culture bottles   Culture   Final    NO GROWTH 5 DAYS Performed at [facility], [address] [place], [place] [address]    Report Status 07/08/2020 FINAL  Final  Blood Culture (routine x 2)     Status: None   Collection Time: June 27, 2020  2:38 PM   Specimen: BLOOD  Result Value Ref Range Status   Specimen Description BLOOD LEFT ANTECUBITAL  Final   Special Requests   Final    BOTTLES DRAWN AEROBIC ONLY Blood Culture results may not be optimal due to an inadequate volume of blood received in culture bottles   Culture   Final    NO GROWTH 5 DAYS Performed at [facility], [address] [place], [place] [address]    Report Status 07/08/2020 FINAL  Final  SARS Coronavirus 2 by RT PCR (hospital order, performed in [facility] hospital lab) Nasopharyngeal Nasopharyngeal Swab     Status: Abnormal   Collection Time: June 27, 2020  2:47 PM   Specimen: Nasopharyngeal Swab  Result Value Ref Range Status   SARS Coronavirus 2 POSITIVE (A) NEGATIVE Final    Comment: RESULT CALLED TO, READ BACK BY AND VERIFIED WITH: Della GooJ,EACKINS PHARM MD @1954  10-01-2020 EB K,SPRANGERS RN @1957  10-01-2020 EB (NOTE) SARS-CoV-2 target nucleic acids are DETECTED  SARS-CoV-2 RNA is generally detectable in upper respiratory specimens  during the acute phase of infection.  Positive results are indicative  of the presence of the identified virus, but do not rule out bacterial infection or co-infection with other pathogens not detected by the test.  Clinical correlation with patient history and  other diagnostic information is necessary to determine  patient infection status.  The expected result is negative.  Fact Sheet for Patients:  BoilerBrush.com.cy   Fact Sheet for Healthcare Providers:   https://pope.com/    This test is not yet approved or cleared by the [place] FDA and  has been authorized for detection and/or diagnosis of SARS-CoV-2 by FDA under an Emergency Use Authorization (EUA).  This EUA will  remain in effect (meaning this test can be used) for the duration of  the COVID-19 declaration under Section 564(b)(1) of the Act, 21 U.S.C. section 360-bbb-3(b)(1), unless the authorization is terminated or revoked sooner.  Performed at [facility], [address], [place], [place] [address]   Urine culture     Status: Abnormal   Collection Time: 06/29/2020  4:15 PM   Specimen: In/Out Cath Urine  Result Value Ref Range Status   Specimen Description IN/OUT CATH URINE  Final   Special Requests NONE  Final   Culture (A)  Final    <10,000 COLONIES/mL INSIGNIFICANT GROWTH Performed at [facility], [address] [place], [place] [address]    Report Status 07/04/2020 FINAL  Final  MRSA PCR Screening     Status: None   Collection Time: 07/04/20  4:07 AM   Specimen: Nasal Mucosa; Nasopharyngeal  Result Value Ref Range Status   MRSA by PCR NEGATIVE NEGATIVE Final    Comment:        The GeneXpert MRSA Assay (FDA approved for NASAL specimens only), is one component of a comprehensive MRSA colonization surveillance program. It is not intended to diagnose MRSA infection nor to guide or monitor treatment for MRSA infections. Performed at [facility], [address] [place], [place] [address]     Radiology Reports US RENAL  Result Date: 07/02/2020 CLINICAL DATA:  Acute renal failure EXAM: RENAL / URINARY TRACT ULTRASOUND COMPLETE COMPARISON:  None. FINDINGS: Right Kidney: Renal measurements: 10.2 x 4.5 x 4.3 cm = volume: 103 mL. Mildly increased  echogenicity seen within the renal parenchyma. No mass or hydronephrosis visualized. Left Kidney: Renal measurements: 11.9 x 5.8 x 4.0 cm = volume: 157 mL. Mildly increased echogenicity seen throughout the renal parenchyma. No mass or hydronephrosis visualized. Bladder: Appears normal for degree of bladder distention. Other: None. IMPRESSION: Diffusely increased parenchymal echogenicity, consistent with medical renal disease. No other acute renal abnormality Electronically Signed   By: Jonna Clark M.D.   On: 06/26/2020 21:21   Portable chest 1 View  Result Date: 07/04/2020 CLINICAL DATA:  Shortness of breath.  COVID-19 positive. EXAM: PORTABLE CHEST 1 VIEW COMPARISON:  July 03, 2020 FINDINGS: Widespread airspace opacity is noted bilaterally, increased from 1 day prior. No frank consolidation. Heart is upper normal in size with pulmonary vascularity normal. No adenopathy. No bone lesions. IMPRESSION: Widespread airspace opacity, overall progressed from 1 day prior, most likely due to widespread atypical organism pneumonia given the history. No consolidation. Stable cardiac silhouette. Electronically Signed   By: Bretta Bang III M.D.   On: 07/04/2020 07:56   DG Chest Port 1 View  Result Date: 06/23/2020 CLINICAL DATA:  Sepsis, short of breath EXAM: PORTABLE CHEST 1 VIEW COMPARISON:  None. FINDINGS: Moderately severe diffuse bilateral airspace disease left greater than right. No pleural effusion. Heart size upper normal. IMPRESSION: Diffuse bilateral airspace disease. Possible pneumonia or edema. Rule out COVID. Electronically Signed   By: Marlan Palau M.D.   On: 06/27/2020 14:40   ECHOCARDIOGRAM COMPLETE  Result Date: 07/04/2020    ECHOCARDIOGRAM REPORT   Patient Name:   ESCHOL AUXIER Date of Exam: 07/04/2020 Medical Rec #:  [id number]       Height:       67.0 in Accession #:    [id number]      Weight:       233.2 lb Date of Birth:  1951/08/17        BSA:          2.159 m Patient Age:    68 years         BP:           131/80 mmHg Patient Gender: M               HR:           53 bpm. Exam Location:  Inpatient Procedure: 2D Echo Indications:    sepsis  History:        Patient has no prior history of Echocardiogram examinations.                 Covid +. no prior cardiac hx listed on file.  Sonographer:    Celene Skeen RDCS (AE) Referring Phys: [id number] MATTHEW J TRIFAN IMPRESSIONS  1. Left ventricular ejection fraction, by estimation, is 55 to 60%. The left ventricle has normal function. The left ventricle has no regional wall motion abnormalities. There is moderate left ventricular hypertrophy. Left ventricular diastolic parameters are indeterminate.  2. Right ventricular systolic function is normal. The right ventricular size is mildly enlarged. Tricuspid regurgitation signal is inadequate for assessing PA pressure.  3. The mitral valve is normal in structure. No evidence of mitral valve regurgitation.  4. The aortic valve was not well visualized. Aortic valve regurgitation is not visualized. No aortic stenosis is present.  5. The inferior vena cava is dilated in size with >50% respiratory variability, suggesting right atrial pressure of 8 mmHg. FINDINGS  Left Ventricle: Left ventricular ejection fraction, by estimation, is 55 to 60%. The left ventricle has normal function. The left ventricle has no regional wall motion abnormalities. The left ventricular internal cavity size was normal in size. There is  moderate left ventricular hypertrophy. Left ventricular diastolic parameters are indeterminate. Right Ventricle: The right ventricular size is mildly enlarged. Right vetricular wall thickness was not assessed. Right ventricular systolic function is normal. Tricuspid regurgitation signal is inadequate for assessing PA pressure. Left Atrium: Left atrial size was normal in size. Right Atrium: Right atrial size was not well visualized. Pericardium: There is no evidence of pericardial effusion. Mitral Valve: The  mitral valve is normal in structure. No evidence of mitral valve regurgitation. Tricuspid Valve: The tricuspid valve is grossly normal. Tricuspid valve regurgitation is not demonstrated. Aortic Valve: The aortic valve was not well visualized. Aortic valve regurgitation is not visualized. No aortic stenosis is present. Pulmonic Valve: The pulmonic valve was not well visualized. Pulmonic valve regurgitation is not visualized. Aorta: The aortic root is normal in size and structure. Venous: The inferior vena cava is dilated in size with greater than 50% respiratory variability, suggesting right atrial pressure of 8 mmHg. IAS/Shunts: The interatrial septum was not well visualized.  LEFT VENTRICLE PLAX 2D LVIDd:         4.66 cm LVIDs:         3.33 cm LV PW:         1.00 cm LV IVS:        1.19 cm LVOT diam:     2.10 cm LV SV:         47 LV SV Index:   22 LVOT Area:  3.46 cm  LEFT ATRIUM           Index LA diam:      3.40 cm 1.57 cm/m LA Vol (A2C): 47.4 ml 21.96 ml/m  AORTIC VALVE LVOT Vmax:   70.50 cm/s LVOT Vmean:  53.500 cm/s LVOT VTI:    0.135 m  AORTA Ao Root diam: 3.10 cm MITRAL VALVE MV Area (PHT): 4.49 cm    SHUNTS MV Decel Time: 169 msec    Systemic VTI:  0.14 m MV E velocity: 74.60 cm/s  Systemic Diam: 2.10 cm MV A velocity: 72.40 cm/s MV E/A ratio:  1.03 Epifanio Lesches MD Electronically signed by Epifanio Lesches MD Signature Date/Time: 07/04/2020/12:54:11 PM    Final    VAS [place] LOWER EXTREMITY VENOUS (DVT)  Result Date: 07/07/2020  Lower Venous DVTStudy Indications: Covid-19, elevated D-Dimer.  Comparison Study: No prior study on file Performing Technologist: Sherren Kerns RVS  Examination Guidelines: A complete evaluation includes B-mode imaging, spectral Doppler, color Doppler, and power Doppler as needed of all accessible portions of each vessel. Bilateral testing is considered an integral part of a complete examination. Limited examinations for reoccurring indications may be performed as  noted. The reflux portion of the exam is performed with the patient in reverse Trendelenburg.  +---------+---------------+---------+-----------+----------+--------------+ RIGHT    CompressibilityPhasicitySpontaneityPropertiesThrombus Aging +---------+---------------+---------+-----------+----------+--------------+ CFV      Full           Yes      Yes                                 +---------+---------------+---------+-----------+----------+--------------+ SFJ      Full                                                        +---------+---------------+---------+-----------+----------+--------------+ FV Prox  Full                                                        +---------+---------------+---------+-----------+----------+--------------+ FV Mid   Full                                                        +---------+---------------+---------+-----------+----------+--------------+ FV DistalFull                                                        +---------+---------------+---------+-----------+----------+--------------+ PFV      Full                                                        +---------+---------------+---------+-----------+----------+--------------+ POP      None  No       No                   Acute          +---------+---------------+---------+-----------+----------+--------------+ PTV      None                                         Acute          +---------+---------------+---------+-----------+----------+--------------+ PERO     None                                         Acute          +---------+---------------+---------+-----------+----------+--------------+ Gastroc  Full                                                        +---------+---------------+---------+-----------+----------+--------------+   +---------+---------------+---------+-----------+----------+--------------+ LEFT      CompressibilityPhasicitySpontaneityPropertiesThrombus Aging +---------+---------------+---------+-----------+----------+--------------+ CFV      Full           Yes      Yes                                 +---------+---------------+---------+-----------+----------+--------------+ SFJ      Full                                                        +---------+---------------+---------+-----------+----------+--------------+ FV Prox  Full                                                        +---------+---------------+---------+-----------+----------+--------------+ FV Mid   Full                                                        +---------+---------------+---------+-----------+----------+--------------+ FV DistalFull                                                        +---------+---------------+---------+-----------+----------+--------------+ PFV      Full                                                        +---------+---------------+---------+-----------+----------+--------------+ POP      Partial        No  No                   Acute          +---------+---------------+---------+-----------+----------+--------------+ PTV      None                                         Acute          +---------+---------------+---------+-----------+----------+--------------+ PERO     None                                         Acute          +---------+---------------+---------+-----------+----------+--------------+ Gastroc  Full                                                        +---------+---------------+---------+-----------+----------+--------------+     Summary: RIGHT: - Findings consistent with acute deep vein thrombosis involving the right popliteal vein, right posterior tibial veins, and right peroneal veins.  LEFT: - Findings consistent with acute deep vein thrombosis involving the left popliteal vein, left posterior tibial  veins, and left peroneal veins.  *See table(s) above for measurements and observations. Electronically signed by Waverly Ferrari MD on 07/07/2020 at 6:52:49 AM.    Final      Huey Bienenstock M.D on 07/08/2020 at 12:24 PM    [facility] -  Office  [phone]

## 2020-07-08 NOTE — Plan of Care (Addendum)
Patient A&O x3, confused but easily reorient. VSS. Pt on 30L of heated high flow oxygen sating above 90%. Lungs sounds regular and  diminished/ dyspnea with exertion. Free from falls. fluids and meals encouraged. Pt voiding adequately during shift.DVT prophylactic: eliquis. No c/o of pain. Bed wheels locked. Phone and call bell within reach. Pt is resting, no distress. POC reviewed with daughter.   Problem: Clinical Measurements: Goal: Ability to maintain clinical measurements within normal limits will improve Outcome: Progressing   Problem: Coping: Goal: Psychosocial and spiritual needs will be supported Outcome: Progressing   Problem: Respiratory: Goal: Will maintain a patent airway Outcome: Progressing Goal: Complications related to the disease process, condition or treatment will be avoided or minimized Outcome: Progressing   Problem: Activity: Goal: Ability to tolerate increased activity will improve Outcome: Progressing   Problem: Clinical Measurements: Goal: Ability to maintain a body temperature in the normal range will improve Outcome: Progressing   Problem: Respiratory: Goal: Ability to maintain adequate ventilation will improve Outcome: Progressing Goal: Ability to maintain a clear airway will improve Outcome: Progressing

## 2020-07-09 ENCOUNTER — Inpatient Hospital Stay (HOSPITAL_COMMUNITY): Payer: No Typology Code available for payment source

## 2020-07-09 DIAGNOSIS — J069 Acute upper respiratory infection, unspecified: Secondary | ICD-10-CM

## 2020-07-09 LAB — BLOOD GAS, ARTERIAL
Acid-base deficit: 7.4 mmol/L — ABNORMAL HIGH (ref 0.0–2.0)
Bicarbonate: 17.3 mmol/L — ABNORMAL LOW (ref 20.0–28.0)
Drawn by: 40662
FIO2: 100
O2 Saturation: 92.3 %
Patient temperature: 37
pCO2 arterial: 33.4 mmHg (ref 32.0–48.0)
pH, Arterial: 7.334 — ABNORMAL LOW (ref 7.350–7.450)
pO2, Arterial: 73.3 mmHg — ABNORMAL LOW (ref 83.0–108.0)

## 2020-07-09 LAB — GLUCOSE, CAPILLARY
Glucose-Capillary: 161 mg/dL — ABNORMAL HIGH (ref 70–99)
Glucose-Capillary: 161 mg/dL — ABNORMAL HIGH (ref 70–99)
Glucose-Capillary: 226 mg/dL — ABNORMAL HIGH (ref 70–99)
Glucose-Capillary: 178 mg/dL — ABNORMAL HIGH (ref 70–99)
Glucose-Capillary: 161 mg/dL — ABNORMAL HIGH (ref 70–99)

## 2020-07-09 LAB — COMPREHENSIVE METABOLIC PANEL
ALT: 41 U/L (ref 0–44)
AST: 46 U/L — ABNORMAL HIGH (ref 15–41)
Albumin: 2.1 g/dL — ABNORMAL LOW (ref 3.5–5.0)
Alkaline Phosphatase: 115 U/L (ref 38–126)
Anion gap: 10 (ref 5–15)
BUN: 82 mg/dL — ABNORMAL HIGH (ref 8–23)
CO2: 18 mmol/L — ABNORMAL LOW (ref 22–32)
Calcium: 8 mg/dL — ABNORMAL LOW (ref 8.9–10.3)
Chloride: 115 mmol/L — ABNORMAL HIGH (ref 98–111)
Creatinine, Ser: 2.64 mg/dL — ABNORMAL HIGH (ref 0.61–1.24)
GFR calc Af Amer: 28 mL/min — ABNORMAL LOW (ref >60)
GFR calc non Af Amer: 24 mL/min — ABNORMAL LOW (ref >60)
Glucose, Bld: 231 mg/dL — ABNORMAL HIGH (ref 70–99)
Potassium: 4.6 mmol/L (ref 3.5–5.1)
Sodium: 143 mmol/L (ref 135–145)
Total Bilirubin: 0.9 mg/dL (ref 0.3–1.2)
Total Protein: 5.2 g/dL — ABNORMAL LOW (ref 6.5–8.1)

## 2020-07-09 LAB — CBC
HCT: 38.4 % — ABNORMAL LOW (ref 39.0–52.0)
Hemoglobin: 12.5 g/dL — ABNORMAL LOW (ref 13.0–17.0)
MCH: 28.3 pg (ref 26.0–34.0)
MCHC: 32.6 g/dL (ref 30.0–36.0)
MCV: 86.9 fL (ref 80.0–100.0)
Platelets: 115 10*3/uL — ABNORMAL LOW (ref 150–400)
RBC: 4.42 MIL/uL (ref 4.22–5.81)
RDW: 13 % (ref 11.5–15.5)
WBC: 11.5 10*3/uL — ABNORMAL HIGH (ref 4.0–10.5)
nRBC: 0.4 % — ABNORMAL HIGH (ref 0.0–0.2)

## 2020-07-09 LAB — D-DIMER, QUANTITATIVE: D-Dimer, Quant: >20 ug/mL-FEU — ABNORMAL HIGH (ref 0.00–0.50)

## 2020-07-09 MED ORDER — DEXMEDETOMIDINE HCL IN NACL 400 MCG/100ML IV SOLN
0.4000 ug/kg/h | INTRAVENOUS | Status: DC
  Administered 2020-07-09 – 2020-07-12 (×2): 0.4 ug/kg/h via INTRAVENOUS
  Filled 2020-07-09 (×3): qty 100

## 2020-07-09 MED ORDER — FUROSEMIDE 10 MG/ML IJ SOLN
60.0000 mg | Freq: Once | INTRAMUSCULAR | Status: AC
  Administered 2020-07-09: 60 mg via INTRAVENOUS
  Filled 2020-07-09: qty 6

## 2020-07-09 MED ORDER — INSULIN DETEMIR 100 UNIT/ML ~~LOC~~ SOLN
10.0000 [IU] | Freq: Two times a day (BID) | SUBCUTANEOUS | Status: DC
  Administered 2020-07-09 – 2020-07-10 (×2): 10 [IU] via SUBCUTANEOUS
  Filled 2020-07-09 (×6): qty 0.1

## 2020-07-09 NOTE — Progress Notes (Signed)
Stat ABG collected by this RT. Kim in lab notified being sent in tube station.

## 2020-07-09 NOTE — Progress Notes (Addendum)
Entered pt's room at approximately 0620 with NT Jaylen. Observed that pt had a tourniquet applied to left lower forearm. Tourniquet removed and hand assessed. 2+ pulses felt, no increase in edema, cap refill less than 3 seconds, hand cool to touch. Pt has no c/o pain in extremity. Charge nurse Kathlene November notified. MD Camillo Flaming paged and alerted. No new orders at this time. Will cont to monitor extremity and relay to dayshift RN.

## 2020-07-09 NOTE — Progress Notes (Signed)
PT Cancellation Note  Patient Details Name: [name] MRN: [id number] DOB: 1950-12-27   Cancelled Treatment:    Reason Eval/Treat Not Completed: Medical issues which prohibited therapy.  Pt transferred to the ICU today due to increasing O2 requirements, increased confusion, sats in the mid to upper 80s even with very high O2 requirements.  PT to re-assess tomorrow if pt is worsening or stable enough to mobilize a little with [place].    Thanks,  Corinna Capra, PT, DPT  Acute Rehabilitation [phone] pager #[phone] office       Lurena Joiner B Appollonia Klee 07/09/2020, 2:16 PM

## 2020-07-09 NOTE — Progress Notes (Signed)
Assisted tele visit to patient with daughter.  Kedric Bumgarner M Felisa Zechman, RN   

## 2020-07-09 NOTE — Progress Notes (Signed)
PROGRESS NOTE                                                                                                                                                                                                             Patient Demographics:    Gregory [name], is a 69 y.o. male, DOB - 01/27/1951, ZOX:[id number]  Admit date - 06/16/2020   Admitting Physician Shirlean Mylaraitlin Mahoney, MD  [facility]  LOS - 6   Chief Complaint  Patient presents with  . Respiratory Distress       Brief Narrative    69 yo BM presented to ED on 06/13/2020 with hypoxia on NRB to 15L and confusion, unable to give history. Code sepsis initiated, s/p 2.25L NS fluid resuscitation. Admitted to ICU.Patient brought to ED after daughter (who lives out of state) called EMS because her father sounded confused on the phone. Per EDP's note, patient reportedly had SpO2 in 40s at home.  Initially patient with significant oxygen requirement, he was admitted to ICU, for which he required heated high flow nasal cannula, overall he has been stabilized however he was transferred to progressive care 8/1, patient was diagnosed with acute bilateral DVT, overall patient has been refusing to take his medication, paranoid, patient with increased oxygen requirement this morning for which PCCM were consulted, where he will be transferred back to ICU for better care, where he is high risk for intubation.   Subjective:    Gregory [name] with couple episodes where he was pulling his heated high flow, creased oxygen requirement this morning .    Assessment  & Plan :    Active Problems:   Sepsis (HCC)   Hypoxia   Shortness of breath   Acute respiratory disease due to COVID-19 virus  Acute hypoxic respiratory failure due to COVID-19 pneumonia -This morning he is with worsening mental status, more lethargic, as well more hypoxic currently requiring 50 L  heated high flow nasal cannula, and 15L NRB . -Continue with IV steroids, I have increased his Decadron to 10 mg IV daily -He was treated with remdesivir -Received Actemra 7/28 -He received IV Lasix today -Continue to monitor inflammatory markers closely -Patient is more lethargic, significantly increased oxygen requirement, PCCM consulted, he will be transferred to ICU, he is high risk for intubation. -  He remains altered, will change to n.p.o. except meds.  SpO2: (!) 87 % O2 Flow Rate (L/min): (S) 50 L/min (15 liter via nonrebreather mask) FiO2 (%): 100 %     COVID-19 Labs  Recent Labs    07/07/20 0726 07/08/20 0419 07/09/20 0747  DDIMER >20.00* >20.00* >20.00*  FERRITIN 2,833* 2,174*  --   CRP 3.5* 1.6*  --     Lab Results  Component Value Date   SARSCOV2NAA POSITIVE (A) 07/02/2020    Renal insufficiency, most likely acute on chronic, unclear baseline. -[facility] of his baseline [facility]. -Improving gradually, renal input greatly appreciated, nonoliguric, most likely related to COVID-19, in the setting of diuretic/ARB use.  Acute DVT -Significantly elevated today >20, venous Doppler significant for bilateral DVT. -Patient started on Eliquis.  He remains altered, or refusing his meds, then may need to change Eliquis to heparin gtt..  Paranoia/encephalopathy -Patient significantly paranoid, noncompliant with medication,he is clearly have significant lack of understanding of his disease, and no understanding of the treatment process, been refusing most of his meds initially. -He is on as needed Haldol for agitation, especially pulling off his nasal cannula multiple times .  Hypertension -Blood pressure is acceptable, continue to monitor on as needed hydralazine  Diabetes mellitus -With A1c of 11.3 -Poorly controlled, now he is n.p.o., will decrease his Levemir to 10 units twice daily, continue with sliding scale .  Code Status  : Full  Family Communication  : Daughter updated by phone daily.  Disposition Plan  :  Status is: Inpatient  Remains inpatient appropriate because:IV treatments appropriate due to intensity of illness or inability to take PO   Dispo: The patient is from: Home              Anticipated d/c is to: Home              Anticipated d/c date is: > 3 days              Patient currently is not medically stable to d/c.     Consults  :  PCCM  Procedures  : None  DVT Prophylaxis  :  [place] heparin>> Eliquis  Lab Results  Component Value Date   PLT 115 (L) 07/09/2020    Antibiotics  :    Anti-infectives (From admission, onward)   Start     Dose/Rate Route Frequency Ordered Stop   07/04/20 1000  remdesivir 100 mg in sodium chloride 0.9 % 100 mL IVPB       "Followed by" Linked Group Details   100 mg 200 mL/hr over 30 Minutes Intravenous Daily 06/09/2020 1953 07/08/20 1024   06/28/2020 2000  remdesivir 200 mg in sodium chloride 0.9% 250 mL IVPB       "Followed by" Linked Group Details   200 mg 580 mL/hr over 30 Minutes Intravenous Once 06/30/2020 1953 06/12/2020 2107   06/23/2020 1445  vancomycin (VANCOREADY) IVPB 2000 mg/400 mL        2,000 mg 200 mL/hr over 120 Minutes Intravenous  Once 06/21/2020 1431 06/29/2020 1827   06/21/2020 1415  ceFEPIme (MAXIPIME) 2 g in sodium chloride 0.9 % 100 mL IVPB        2 g 200 mL/hr over 30 Minutes Intravenous  Once 06/18/2020 1411 06/07/2020 1530   06/28/2020 1415  metroNIDAZOLE (FLAGYL) IVPB 500 mg        500 mg 100 mL/hr over 60 Minutes Intravenous  Once 06/18/2020 1411  07-10-2020 1601   July 10, 2020 1415  vancomycin (VANCOCIN) IVPB 1000 mg/200 mL premix  Status:  Discontinued        1,000 mg 200 mL/hr over 60 Minutes Intravenous  Once 2020/07/10 1411 07-10-2020 1601        Objective:   Vitals:   07/09/20 0724 07/09/20 0800 07/09/20 0853 07/09/20 1024  BP: (!) 152/85     Pulse: 86   93  Resp: (!) 22   (!) 23  Temp: 97.7 F (36.5 C)     TempSrc: Axillary     SpO2: 92%  (!) 89% 93% (!) 87%  Weight:      Height:        Wt Readings from Last 3 Encounters:  07/09/20 101.6 kg     Intake/Output Summary (Last 24 hours) at 07/09/2020 1152 Last data filed at 07/09/2020 0849 Gross per 24 hour  Intake 540 ml  Output 2100 ml  Net -1560 ml     Physical Exam  Patient is confused,more lethargic, unable to answer any questions appropriately, or to follow any commands . Symmetrical Chest wall movement, tachypneic, no wheezing RRR,No Gallops,Rubs or new Murmurs, No Parasternal Heave +ve B.Sounds, Abd Soft, No tenderness, No rebound - guarding or rigidity. No Cyanosis, Clubbing ,+1  edema, No new Rash or bruise         Data Review:    CBC Recent Labs  Lab 07/04/20 0720 07/04/20 0720 07/05/20 0711 07/06/20 0846 07/07/20 0726 07/08/20 0419 07/09/20 0747  WBC 3.3*   < > 7.1 10.4 14.4* 15.9* 11.5*  HGB 10.6*   < > 11.0* 12.0* 12.2* 12.3* 12.5*  HCT 33.1*   < > 34.4* 36.6* 37.5* 37.6* 38.4*  PLT 150   < > 189 186 132* 126* 115*  MCV 91.2   < > 89.8 88.4 87.8 89.1 86.9  MCH 29.2   < > 28.7 29.0 28.6 29.1 28.3  MCHC 32.0   < > 32.0 32.8 32.5 32.7 32.6  RDW 12.4   < > 12.5 12.7 13.0 13.2 13.0  LYMPHSABS 0.3*  --  0.9 1.1 1.9 1.2  --   MONOABS 0.0*  --  0.5 0.5 0.7 0.4  --   EOSABS 0.0  --  0.0 0.0 0.0 0.0  --   BASOSABS 0.0  --  0.0 0.0 0.0 0.0  --    < > = values in this interval not displayed.    Chemistries  Recent Labs  Lab 07/04/20 0720 07/04/20 1148 07/05/20 0711 07/05/20 0711 07/05/20 1420 07/06/20 0846 07/07/20 0726 07/08/20 0419 07/09/20 0747  NA 144   < > 142   < > 141 143 142 140 143  K 5.2*   < > 5.4*   < > 4.9 4.8 4.8 5.4* 4.6  CL 114*   < > 112*   < > 112* 113* 112* 111 115*  CO2 20*   < > 19*   < > 18* 20* 21* 19* 18*  GLUCOSE 317*   < > 337*   < > 365* 305* 249* 391* 231*  BUN 59*   < > 62*   < > 66* 73* 87* 87* 82*  CREATININE 3.72*   < > 2.99*   < > 3.17* 3.07* 3.11* 2.82* 2.64*  CALCIUM 7.7*   < > 8.0*   < > 8.0*  8.2* 8.0* 7.8* 8.0*  MG 2.2  --  2.4  --   --  2.6* 2.6* 2.6*  --  AST 231*  --  101*  --   --  54* 50* 33 46*  ALT 113*  --  86*  --   --  72* 60* 45* 41  ALKPHOS 40  --  50  --   --  70 87 95 115  BILITOT 0.6  --  0.4  --   --  0.7 0.8 0.8 0.9   < > = values in this interval not displayed.   ------------------------------------------------------------------------------------------------------------------ No results for input(s): CHOL, HDL, LDLCALC, TRIG, CHOLHDL, LDLDIRECT in the last 72 hours.  Lab Results  Component Value Date   HGBA1C 11.3 (H) 07/04/2020   ------------------------------------------------------------------------------------------------------------------ No results for input(s): TSH, T4TOTAL, T3FREE, THYROIDAB in the last 72 hours.  Invalid input(s): FREET3 ------------------------------------------------------------------------------------------------------------------ Recent Labs    07/07/20 0726 07/08/20 0419  FERRITIN 2,833* 2,174*    Coagulation profile Recent Labs  Lab 06/25/2020 1415  INR 1.0    Recent Labs    07/08/20 0419 07/09/20 0747  DDIMER >20.00* >20.00*    Cardiac Enzymes No results for input(s): CKMB, TROPONINI, MYOGLOBIN in the last 168 hours.  Invalid input(s): CK ------------------------------------------------------------------------------------------------------------------    Component Value Date/Time   BNP 78.5 06/13/2020 1415    Inpatient Medications  Scheduled Meds: . apixaban  10 mg Oral BID   Followed by  . [START ON 07/15/2020] apixaban  5 mg Oral BID  . chlorhexidine  15 mL Mouth Rinse BID  . Chlorhexidine Gluconate Cloth  6 each Topical Q0600  . dexamethasone (DECADRON) injection  10 mg Intravenous Q24H  . docusate sodium  100 mg Oral BID  . insulin aspart  0-20 Units Subcutaneous TID WC  . insulin aspart  0-5 Units Subcutaneous QHS  . insulin aspart  6 Units Subcutaneous TID WC  . insulin detemir  20 Units  Subcutaneous BID  . mouth rinse  15 mL Mouth Rinse q12n4p  . pantoprazole  40 mg Oral Daily  . sodium chloride flush  10-40 mL Intracatheter Q12H  . sodium chloride flush  3 mL Intravenous Q12H   Continuous Infusions:  PRN Meds:.acetaminophen, cloNIDine, haloperidol lactate, pneumococcal 23 valent vaccine, polyethylene glycol, sodium chloride flush  Micro Results Recent Results (from the past 240 hour(s))  Blood Culture (routine x 2)     Status: None   Collection Time: 06/07/2020  2:10 PM   Specimen: BLOOD  Result Value Ref Range Status   Specimen Description BLOOD RIGHT ANTECUBITAL  Final   Special Requests   Final    BOTTLES DRAWN AEROBIC AND ANAEROBIC Blood Culture results may not be optimal due to an inadequate volume of blood received in culture bottles   Culture   Final    NO GROWTH 5 DAYS Performed at [facility], [address], [place], [place] [address]    Report Status 07/08/2020 FINAL  Final  Blood Culture (routine x 2)     Status: None   Collection Time: 07/04/2020  2:38 PM   Specimen: BLOOD  Result Value Ref Range Status   Specimen Description BLOOD LEFT ANTECUBITAL  Final   Special Requests   Final    BOTTLES DRAWN AEROBIC ONLY Blood Culture results may not be optimal due to an inadequate volume of blood received in culture bottles   Culture   Final    NO GROWTH 5 DAYS Performed at [facility], [address], [place], [place] [address]    Report Status 07/08/2020 FINAL  Final  SARS Coronavirus 2 by RT  PCR (hospital order, performed in [facility] hospital lab) Nasopharyngeal Nasopharyngeal Swab     Status: Abnormal   Collection Time: 06/25/2020  2:47 PM   Specimen: Nasopharyngeal Swab  Result Value Ref Range Status   SARS Coronavirus 2 POSITIVE (A) NEGATIVE Final    Comment: RESULT CALLED TO, READ BACK BY AND VERIFIED WITH: Della Goo MD @1954  06/22/2020 EB K,SPRANGERS RN @1957  06/25/2020 EB (NOTE) SARS-CoV-2 target nucleic acids are  DETECTED  SARS-CoV-2 RNA is generally detectable in upper respiratory specimens  during the acute phase of infection.  Positive results are indicative  of the presence of the identified virus, but do not rule out bacterial infection or co-infection with other pathogens not detected by the test.  Clinical correlation with patient history and  other diagnostic information is necessary to determine patient infection status.  The expected result is negative.  Fact Sheet for Patients:     Fact Sheet for Healthcare Providers:   07/05/20    This test is not yet approved or cleared by the [employer] FDA and  has been authorized for detection and/or diagnosis of SARS-CoV-2 by FDA under an Emergency Use Authorization (EUA).  This EUA will  remain in effect (meaning this test can be used) for the duration of  the COVID-19 declaration under Section 564(b)(1) of the Act, 21 U.S.C. section 360-bbb-3(b)(1), unless the authorization is terminated or revoked sooner.  Performed at [facility], [address] [place], [address] [place]   Urine culture     Status: Abnormal   Collection Time: 06/13/2020  4:15 PM   Specimen: In/Out Cath Urine  Result Value Ref Range Status   Specimen Description IN/OUT CATH URINE  Final   Special Requests NONE  Final   Culture (A)  Final    <10,000 COLONIES/mL INSIGNIFICANT GROWTH Performed at [facility], [address], [place], [address] [place]    Report Status 07/04/2020 FINAL  Final  MRSA PCR Screening     Status: None   Collection Time: 07/04/20  4:07 AM   Specimen: Nasal Mucosa; Nasopharyngeal  Result Value Ref Range Status   MRSA by PCR NEGATIVE NEGATIVE Final    Comment:        The GeneXpert MRSA Assay (FDA approved for NASAL specimens only), is one component of a comprehensive MRSA colonization surveillance program. It is not intended to diagnose  MRSA infection nor to guide or monitor treatment for MRSA infections. Performed at [facility], [address], [place], [address] [place]     Radiology Reports [place] RENAL  Result Date: 06/25/2020 CLINICAL DATA:  Acute renal failure EXAM: RENAL / URINARY TRACT ULTRASOUND COMPLETE COMPARISON:  None. FINDINGS: Right Kidney: Renal measurements: 10.2 x 4.5 x 4.3 cm = volume: 103 mL. Mildly increased echogenicity seen within the renal parenchyma. No mass or hydronephrosis visualized. Left Kidney: Renal measurements: 11.9 x 5.8 x 4.0 cm = volume: 157 mL. Mildly increased echogenicity seen throughout the renal parenchyma. No mass or hydronephrosis visualized. Bladder: Appears normal for degree of bladder distention. Other: None. IMPRESSION: Diffusely increased parenchymal echogenicity, consistent with medical renal disease. No other acute renal abnormality Electronically Signed   By: [place] M.D.   On: 06/23/2020 21:21   DG Chest Port 1 View  Result Date: 07/09/2020 CLINICAL DATA:  Shortness of breath with cough and congestion EXAM: PORTABLE CHEST 1 VIEW COMPARISON:  July 04, 2020 FINDINGS: There has been significant although incomplete partial clearing bilaterally  compared to recent study. Ill-defined airspace opacity remains in portions of the lung bases. No new opacity evident. Heart size and pulmonary vascularity are normal. No adenopathy. No bone lesions. IMPRESSION: Significant partial clearing of airspace opacity bilaterally. Mild residual airspace opacity in the bases present. No new opacity evident. Cardiac silhouette within normal limits. No adenopathy. Electronically Signed   By: Bretta Bang III M.D.   On: 07/09/2020 11:32   Portable chest 1 View  Result Date: 07/04/2020 CLINICAL DATA:  Shortness of breath.  COVID-19 positive. EXAM: PORTABLE CHEST 1 VIEW COMPARISON:  2020/07/21 FINDINGS: Widespread airspace opacity is noted bilaterally, increased from 1 day prior. No frank  consolidation. Heart is upper normal in size with pulmonary vascularity normal. No adenopathy. No bone lesions. IMPRESSION: Widespread airspace opacity, overall progressed from 1 day prior, most likely due to widespread atypical organism pneumonia given the history. No consolidation. Stable cardiac silhouette. Electronically Signed   By: Bretta Bang III M.D.   On: 07/04/2020 07:56   DG Chest Port 1 View  Result Date: 07-21-20 CLINICAL DATA:  Sepsis, short of breath EXAM: PORTABLE CHEST 1 VIEW COMPARISON:  None. FINDINGS: Moderately severe diffuse bilateral airspace disease left greater than right. No pleural effusion. Heart size upper normal. IMPRESSION: Diffuse bilateral airspace disease. Possible pneumonia or edema. Rule out COVID. Electronically Signed   By: Marlan Palau M.D.   On: Jul 21, 2020 14:40   ECHOCARDIOGRAM COMPLETE  Result Date: 07/04/2020    ECHOCARDIOGRAM REPORT   Patient Name:   JEFFREY GRAEFE Date of Exam: 07/04/2020 Medical Rec #:  [id number]       Height:       67.0 in Accession #:    [id number]      Weight:       233.2 lb Date of Birth:  1951/10/22        BSA:          2.159 m Patient Age:    68 years        BP:           131/80 mmHg Patient Gender: M               HR:           53 bpm. Exam Location:  Inpatient Procedure: 2D Echo Indications:    sepsis  History:        Patient has no prior history of Echocardiogram examinations.                 Covid +. no prior cardiac hx listed on file.  Sonographer:    Celene Skeen RDCS (AE) Referring Phys: [id number] MATTHEW J TRIFAN IMPRESSIONS  1. Left ventricular ejection fraction, by estimation, is 55 to 60%. The left ventricle has normal function. The left ventricle has no regional wall motion abnormalities. There is moderate left ventricular hypertrophy. Left ventricular diastolic parameters are indeterminate.  2. Right ventricular systolic function is normal. The right ventricular size is mildly enlarged. Tricuspid regurgitation signal is  inadequate for assessing PA pressure.  3. The mitral valve is normal in structure. No evidence of mitral valve regurgitation.  4. The aortic valve was not well visualized. Aortic valve regurgitation is not visualized. No aortic stenosis is present.  5. The inferior vena cava is dilated in size with >50% respiratory variability, suggesting right atrial pressure of 8 mmHg. FINDINGS  Left Ventricle: Left ventricular ejection fraction, by estimation, is 55 to 60%. The left ventricle has normal function. The  left ventricle has no regional wall motion abnormalities. The left ventricular internal cavity size was normal in size. There is  moderate left ventricular hypertrophy. Left ventricular diastolic parameters are indeterminate. Right Ventricle: The right ventricular size is mildly enlarged. Right vetricular wall thickness was not assessed. Right ventricular systolic function is normal. Tricuspid regurgitation signal is inadequate for assessing PA pressure. Left Atrium: Left atrial size was normal in size. Right Atrium: Right atrial size was not well visualized. Pericardium: There is no evidence of pericardial effusion. Mitral Valve: The mitral valve is normal in structure. No evidence of mitral valve regurgitation. Tricuspid Valve: The tricuspid valve is grossly normal. Tricuspid valve regurgitation is not demonstrated. Aortic Valve: The aortic valve was not well visualized. Aortic valve regurgitation is not visualized. No aortic stenosis is present. Pulmonic Valve: The pulmonic valve was not well visualized. Pulmonic valve regurgitation is not visualized. Aorta: The aortic root is normal in size and structure. Venous: The inferior vena cava is dilated in size with greater than 50% respiratory variability, suggesting right atrial pressure of 8 mmHg. IAS/Shunts: The interatrial septum was not well visualized.  LEFT VENTRICLE PLAX 2D LVIDd:         4.66 cm LVIDs:         3.33 cm LV PW:         1.00 cm LV IVS:        1.19  cm LVOT diam:     2.10 cm LV SV:         47 LV SV Index:   22 LVOT Area:     3.46 cm  LEFT ATRIUM           Index LA diam:      3.40 cm 1.57 cm/m LA Vol (A2C): 47.4 ml 21.96 ml/m  AORTIC VALVE LVOT Vmax:   70.50 cm/s LVOT Vmean:  53.500 cm/s LVOT VTI:    0.135 m  AORTA Ao Root diam: 3.10 cm MITRAL VALVE MV Area (PHT): 4.49 cm    SHUNTS MV Decel Time: 169 msec    Systemic VTI:  0.14 m MV E velocity: 74.60 cm/s  Systemic Diam: 2.10 cm MV A velocity: 72.40 cm/s MV E/A ratio:  1.03 Epifanio Lesches MD Electronically signed by Epifanio Lesches MD Signature Date/Time: 07/04/2020/12:54:11 PM    Final    VAS [place] LOWER EXTREMITY VENOUS (DVT)  Result Date: 07/07/2020  Lower Venous DVTStudy Indications: Covid-19, elevated D-Dimer.  Comparison Study: No prior study on file Performing Technologist: Sherren Kerns RVS  Examination Guidelines: A complete evaluation includes B-mode imaging, spectral Doppler, color Doppler, and power Doppler as needed of all accessible portions of each vessel. Bilateral testing is considered an integral part of a complete examination. Limited examinations for reoccurring indications may be performed as noted. The reflux portion of the exam is performed with the patient in reverse Trendelenburg.  +---------+---------------+---------+-----------+----------+--------------+ RIGHT    CompressibilityPhasicitySpontaneityPropertiesThrombus Aging +---------+---------------+---------+-----------+----------+--------------+ CFV      Full           Yes      Yes                                 +---------+---------------+---------+-----------+----------+--------------+ SFJ      Full                                                        +---------+---------------+---------+-----------+----------+--------------+  FV Prox  Full                                                        +---------+---------------+---------+-----------+----------+--------------+ FV Mid   Full                                                         +---------+---------------+---------+-----------+----------+--------------+ FV DistalFull                                                        +---------+---------------+---------+-----------+----------+--------------+ PFV      Full                                                        +---------+---------------+---------+-----------+----------+--------------+ POP      None           No       No                   Acute          +---------+---------------+---------+-----------+----------+--------------+ PTV      None                                         Acute          +---------+---------------+---------+-----------+----------+--------------+ PERO     None                                         Acute          +---------+---------------+---------+-----------+----------+--------------+ Gastroc  Full                                                        +---------+---------------+---------+-----------+----------+--------------+   +---------+---------------+---------+-----------+----------+--------------+ LEFT     CompressibilityPhasicitySpontaneityPropertiesThrombus Aging +---------+---------------+---------+-----------+----------+--------------+ CFV      Full           Yes      Yes                                 +---------+---------------+---------+-----------+----------+--------------+ SFJ      Full                                                        +---------+---------------+---------+-----------+----------+--------------+  FV Prox  Full                                                        +---------+---------------+---------+-----------+----------+--------------+ FV Mid   Full                                                        +---------+---------------+---------+-----------+----------+--------------+ FV DistalFull                                                         +---------+---------------+---------+-----------+----------+--------------+ PFV      Full                                                        +---------+---------------+---------+-----------+----------+--------------+ POP      Partial        No       No                   Acute          +---------+---------------+---------+-----------+----------+--------------+ PTV      None                                         Acute          +---------+---------------+---------+-----------+----------+--------------+ PERO     None                                         Acute          +---------+---------------+---------+-----------+----------+--------------+ Gastroc  Full                                                        +---------+---------------+---------+-----------+----------+--------------+     Summary: RIGHT: - Findings consistent with acute deep vein thrombosis involving the right popliteal vein, right posterior tibial veins, and right peroneal veins.  LEFT: - Findings consistent with acute deep vein thrombosis involving the left popliteal vein, left posterior tibial veins, and left peroneal veins.  *See table(s) above for measurements and observations. Electronically signed by Waverly Ferrari MD on 07/07/2020 at 6:52:49 AM.    Final      Huey Bienenstock M.D on 07/09/2020 at 11:52 AM    [facility] -  Office  [phone]

## 2020-07-09 NOTE — Progress Notes (Signed)
Pt repeatedly removing HFNC and desatting to 70s. Pt told to keep O2 on and continues to remove. O2 reapplied and NRB mask applied in order to help pt's O2 recover. IV haldol given per order for agitation. Pt currently laying in bed with HFNC on and o2 satting at 90%. Will cont to monitor.

## 2020-07-09 NOTE — Progress Notes (Signed)
NAME:  Gregory [name], MRN:  [id number], DOB:  Feb 14, 1951, LOS: 6 ADMISSION DATE:  07/23/2020, CONSULTATION DATE:  23-Jul-2020 REFERRING MD:  ED, CHIEF COMPLAINT:  SOB, hypoxemia   Brief History   69 yo BM presented to ED on Jul 23, 2020 with hypoxia on NRB to 15L and confusion, unable to give history. Code sepsis initiated, s/p 2.25L NS fluid resuscitation. Admitted to ICU.  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  Transferred to intensive care unit for closer monitoring possible intubation  Objective   Blood pressure (!) 152/85, pulse 93, temperature 97.7 F (36.5 C), temperature source Axillary, resp. rate (!) 23, height 5\' 7"  (1.702 m), weight 101.6 kg, SpO2 (!) 87 %.    FiO2 (%):  [70 %-100 %] 100 %   Intake/Output Summary (Last 24 hours) at 07/09/2020 1047 Last data filed at 07/09/2020 0849 Gross per 24 hour  Intake 540 ml  Output 2100 ml  Net -1560 ml   Filed Weights   07/07/20 0500 07/08/20 0551 07/09/20 0352  Weight: (!) 104 kg (!) 102.4 kg 101.6 kg    Examination: General: 69 year old male appears older than stated age. HEENT: Currently on nonrebreather 70% and high flow at 50% with sats are 93%.  No JVD or lymphadenopathy is appreciated Neuro:  CV: Heart sounds are regular regular rate rhythm PULM: Decreased breath sounds throughout  GI: soft, bsx4 active  Extremities: warm/dry, 1+ edema  Skin: no rashes or lesions   Assessment & Plan:  Acute Hypoxic Respiratory  Failure d/t COVID-19 07/09/2020 transfer intensive care unit for better monitoring O2 to keep saturations greater than 88% Due to his worsening pulmonary status he may require intubation in near future Continue current medical interventions Repeat chest x-ray  Acute Renal Failure vs CKD Lab Results  Component Value Date   CREATININE 2.64 (H) 07/09/2020   CREATININE 2.82 (H) 07/08/2020   CREATININE 3.11 (H) 07/07/2020  He has been evaluated by nephrology Creatinine is actually improved Consistent with chronic kidney disease   Altered mental status with paranoid behavior Monitor closely intensive care unit No psychotropic medications are noted on his home medication list  Hyperglycemia in the setting of high-dose Decadron CBG (last 3)  Recent Labs    07/08/20 1558 07/08/20 2047 07/09/20 0722  GLUCAP 264* 135* 161*  161*   Sliding-scale insulin protocol    Best practice:  Diet: Carb modified Pain/Anxiety/Delirium protocol (if indicated): N/A VAP protocol (if indicated): N/A DVT prophylaxis: heparin GI prophylaxis: N/A Glucose control: Levemir + SSI Mobility: Up in chair Code Status: Full Disposition: 07/09/2020 transfer back to intensive care unit for closer evaluation and treatment  Labs   CBC: Recent Labs  Lab 07/04/20 0720 07/04/20 0720 07/05/20 0711 07/06/20 0846 07/07/20 0726 07/08/20 0419 07/09/20 0747  WBC 3.3*   < > 7.1 10.4 14.4* 15.9* 11.5*  NEUTROABS 2.9  --  5.6 8.1* 10.9* 13.7*  --   HGB 10.6*   < > 11.0* 12.0* 12.2* 12.3* 12.5*  HCT 33.1*   < >  34.4* 36.6* 37.5* 37.6* 38.4*  MCV 91.2   < > 89.8 88.4 87.8 89.1 86.9  PLT 150   < > 189 186 132* 126* 115*   < > = values in this interval not displayed.    Basic Metabolic Panel: Recent Labs  Lab 07/04/20 0720 07/04/20 1148 07/05/20 0711 07/05/20 0711 07/05/20 1420 07/06/20 0846 07/07/20 0726 07/08/20 0419 07/09/20 0747  NA 144   < > 142   < > 141 143 142 140 143  K 5.2*   < > 5.4*   < >  4.9 4.8 4.8 5.4* 4.6  CL 114*   < > 112*   < > 112* 113* 112* 111 115*  CO2 20*   < > 19*   < > 18* 20* 21* 19* 18*  GLUCOSE 317*   < > 337*   < > 365* 305* 249* 391* 231*  BUN 59*   < > 62*   < > 66* 73* 87* 87* 82*  CREATININE 3.72*   < > 2.99*   < > 3.17* 3.07* 3.11* 2.82* 2.64*  CALCIUM 7.7*   < > 8.0*   < > 8.0* 8.2* 8.0* 7.8* 8.0*  MG 2.2  --  2.4  --   --  2.6* 2.6* 2.6*  --   PHOS 4.1  --  4.1  --   --  4.3 5.0* 4.8*  --    < > = values in this interval not displayed.   GFR: Estimated Creatinine Clearance: 30.8 mL/min (A) (by C-G formula based on SCr of 2.64 mg/dL (H)). Recent Labs  Lab 2020-07-11 1415 07/04/20 0720 07/06/20 0846 07/07/20 0726 07/08/20 0419 07/09/20 0747  PROCALCITON 1.79  --   --   --   --   --   WBC 4.9   < > 10.4 14.4* 15.9* 11.5*  LATICACIDVEN 1.9  --   --   --   --   --    < > = values in this interval not displayed.    Liver Function Tests: Recent Labs  Lab 07/05/20 0711 07/06/20 0846 07/07/20 0726 07/08/20 0419 07/09/20 0747  AST 101* 54* 50* 33 46*  ALT 86* 72* 60* 45* 41  ALKPHOS 50 70 87 95 115  BILITOT 0.4 0.7 0.8 0.8 0.9  PROT 5.6* 6.3* 5.3* 5.0* 5.2*  ALBUMIN 1.8* 1.9* 2.1* 2.1* 2.1*   No results for input(s): LIPASE, AMYLASE in the last 168 hours. No results for input(s): AMMONIA in the last 168 hours.  ABG    Component Value Date/Time   PHART 7.375 07-11-2020 1535   PCO2ART 33.7 07/11/20 1535   PO2ART 68 (L) 07-11-2020 1535   HCO3 19.7 (L) 2020-07-11 1535   TCO2 21 (L) 07/11/20 1535   ACIDBASEDEF 5.0 (H) 07-11-2020 1535   O2SAT 93.0 07/11/2020 1535     Coagulation Profile: Recent Labs  Lab 07/11/20 1415  INR 1.0    Cardiac Enzymes: Recent Labs  Lab 2020/07/11 1830  CKTOTAL 876*    HbA1C: Hgb A1c MFr Bld  Date/Time Value Ref Range Status  07/04/2020 07:20 AM 11.3 (H) 4.8 - 5.6 % Final    Comment:    (NOTE) Pre diabetes:          5.7%-6.4%  Diabetes:              >6.4%  Glycemic control for    <7.0% adults with diabetes     CBG: Recent Labs  Lab 07/08/20 0830 07/08/20 1139  07/08/20 1558 07/08/20 2047 07/09/20 0722  GLUCAP 402* 343* 264* 135* 161*  161*     Critical care time: 40 min       Brett Canales Alonzo Owczarzak ACNP Acute Care Nurse Practitioner [facility]/Critical [facility] Please consult Amion 07/09/2020, 10:47 AM

## 2020-07-09 NOTE — Care Management (Signed)
Contact Info for [facility] providers:  Lavell Luster [phone] [facility] to provide PCS/home care  [facility]- [facility] - Dr. Aileen Pilot -  patient is 100% service connected  SW, [place] [phone]) -- updated SW, Alvy Bimler [phone], fax [phone]

## 2020-07-09 NOTE — Discharge Instructions (Signed)
Information on my medicine - ELIQUIS (apixaban)  Why was Eliquis prescribed for you? Eliquis was prescribed to treat blood clots that may have been found in the veins of your legs (deep vein thrombosis) or in your lungs (pulmonary embolism) and to reduce the risk of them occurring again.  What do You need to know about Eliquis ? The starting dose is 10 mg (two 5 mg tablets) taken TWICE daily for the FIRST SEVEN (7) DAYS, then on 07/15/2020 is reduced to ONE 5 mg tablet taken TWICE daily.  Eliquis may be taken with or without food.   Try to take the dose about the same time in the morning and in the evening. If you have difficulty swallowing the tablet whole please discuss with your pharmacist how to take the medication safely.  Take Eliquis exactly as prescribed and DO NOT stop taking Eliquis without talking to the doctor who prescribed the medication.  Stopping may increase your risk of developing a new blood clot.  Refill your prescription before you run out.  After discharge, you should have regular check-up appointments with your healthcare provider that is prescribing your Eliquis.    What do you do if you miss a dose? If a dose of ELIQUIS is not taken at the scheduled time, take it as soon as possible on the same day and twice-daily administration should be resumed. The dose should not be doubled to make up for a missed dose.  Important Safety Information A possible side effect of Eliquis is bleeding. You should call your healthcare provider right away if you experience any of the following: ? Bleeding from an injury or your nose that does not stop. ? Unusual colored urine (red or dark brown) or unusual colored stools (red or black). ? Unusual bruising for unknown reasons. ? A serious fall or if you hit your head (even if there is no bleeding).  Some medicines may interact with Eliquis and might increase your risk of bleeding or clotting while on Eliquis. To help avoid this,  consult your healthcare provider or pharmacist prior to using any new prescription or non-prescription medications, including herbals, vitamins, non-steroidal anti-inflammatory drugs (NSAIDs) and supplements.  This website has more information on Eliquis (apixaban): http://www.eliquis.com/eliquis/home

## 2020-07-09 NOTE — Progress Notes (Addendum)
Patient transferred to ICU for increasing O2 requirement on 50 liters of heated high flow and 15 liters non rebreather mask sating at 85%-88%.  All belongings transferred with patient. Daughter and son updated on POC. Report given to Cindy,RN. Care relinquished at this time.

## 2020-07-09 NOTE — Progress Notes (Signed)
eLink Physician-Brief Progress Note Patient Name: Jacaden Forbush DOB: Jul 08, 1951 MRN: [id number]   Date of Service  07/09/2020  HPI/Events of Note  Notified on restlessness and confusion. Patient keeps pulling off NRB mask, also on heated high flow.  eICU Interventions  Obtain ABG Start Precedex Low threshold for intubation     Intervention Category Minor Interventions: Agitation / anxiety - evaluation and management  Darl Pikes 07/09/2020, 9:44 PM

## 2020-07-10 ENCOUNTER — Inpatient Hospital Stay (HOSPITAL_COMMUNITY): Payer: No Typology Code available for payment source

## 2020-07-10 DIAGNOSIS — J1282 Pneumonia due to coronavirus disease 2019: Secondary | ICD-10-CM

## 2020-07-10 DIAGNOSIS — I82432 Acute embolism and thrombosis of left popliteal vein: Secondary | ICD-10-CM

## 2020-07-10 DIAGNOSIS — R6520 Severe sepsis without septic shock: Secondary | ICD-10-CM

## 2020-07-10 DIAGNOSIS — N179 Acute kidney failure, unspecified: Secondary | ICD-10-CM

## 2020-07-10 DIAGNOSIS — J9601 Acute respiratory failure with hypoxia: Secondary | ICD-10-CM

## 2020-07-10 LAB — CBC
HCT: 37.7 % — ABNORMAL LOW (ref 39.0–52.0)
HCT: 40.1 % (ref 39.0–52.0)
Hemoglobin: 12.4 g/dL — ABNORMAL LOW (ref 13.0–17.0)
Hemoglobin: 12.7 g/dL — ABNORMAL LOW (ref 13.0–17.0)
MCH: 29.2 pg (ref 26.0–34.0)
MCH: 28.6 pg (ref 26.0–34.0)
MCHC: 32.9 g/dL (ref 30.0–36.0)
MCHC: 31.7 g/dL (ref 30.0–36.0)
MCV: 88.7 fL (ref 80.0–100.0)
MCV: 90.3 fL (ref 80.0–100.0)
Platelets: 101 10*3/uL — ABNORMAL LOW (ref 150–400)
Platelets: 94 10*3/uL — ABNORMAL LOW (ref 150–400)
RBC: 4.25 MIL/uL (ref 4.22–5.81)
RBC: 4.44 MIL/uL (ref 4.22–5.81)
RDW: 13.2 % (ref 11.5–15.5)
RDW: 13.4 % (ref 11.5–15.5)
WBC: 7.7 10*3/uL (ref 4.0–10.5)
WBC: 4.8 10*3/uL (ref 4.0–10.5)
nRBC: 0.5 % — ABNORMAL HIGH (ref 0.0–0.2)
nRBC: 0.6 % — ABNORMAL HIGH (ref 0.0–0.2)

## 2020-07-10 LAB — POCT I-STAT 7, (LYTES, BLD GAS, ICA,H+H)
Acid-base deficit: 4 mmol/L — ABNORMAL HIGH (ref 0.0–2.0)
Acid-base deficit: 6 mmol/L — ABNORMAL HIGH (ref 0.0–2.0)
Acid-base deficit: 6 mmol/L — ABNORMAL HIGH (ref 0.0–2.0)
Bicarbonate: 21 mmol/L (ref 20.0–28.0)
Bicarbonate: 19.2 mmol/L — ABNORMAL LOW (ref 20.0–28.0)
Bicarbonate: 19.6 mmol/L — ABNORMAL LOW (ref 20.0–28.0)
Calcium, Ion: 1.24 mmol/L (ref 1.15–1.40)
Calcium, Ion: 1.15 mmol/L (ref 1.15–1.40)
Calcium, Ion: 1.17 mmol/L (ref 1.15–1.40)
HCT: 33 % — ABNORMAL LOW (ref 39.0–52.0)
HCT: 34 % — ABNORMAL LOW (ref 39.0–52.0)
HCT: 34 % — ABNORMAL LOW (ref 39.0–52.0)
Hemoglobin: 11.2 g/dL — ABNORMAL LOW (ref 13.0–17.0)
Hemoglobin: 11.6 g/dL — ABNORMAL LOW (ref 13.0–17.0)
Hemoglobin: 11.6 g/dL — ABNORMAL LOW (ref 13.0–17.0)
O2 Saturation: 87 %
O2 Saturation: 78 %
O2 Saturation: 79 %
Patient temperature: 98.1
Patient temperature: 96.6
Patient temperature: 96.6
Potassium: 5.3 mmol/L — ABNORMAL HIGH (ref 3.5–5.1)
Potassium: 5.1 mmol/L (ref 3.5–5.1)
Potassium: 5.2 mmol/L — ABNORMAL HIGH (ref 3.5–5.1)
Sodium: 149 mmol/L — ABNORMAL HIGH (ref 135–145)
Sodium: 147 mmol/L — ABNORMAL HIGH (ref 135–145)
Sodium: 147 mmol/L — ABNORMAL HIGH (ref 135–145)
TCO2: 22 mmol/L (ref 22–32)
TCO2: 20 mmol/L — ABNORMAL LOW (ref 22–32)
TCO2: 21 mmol/L — ABNORMAL LOW (ref 22–32)
pCO2 arterial: 37.2 mmHg (ref 32.0–48.0)
pCO2 arterial: 36 mmHg (ref 32.0–48.0)
pCO2 arterial: 36.8 mmHg (ref 32.0–48.0)
pH, Arterial: 7.357 (ref 7.350–7.450)
pH, Arterial: 7.329 — ABNORMAL LOW (ref 7.350–7.450)
pH, Arterial: 7.329 — ABNORMAL LOW (ref 7.350–7.450)
pO2, Arterial: 54 mmHg — ABNORMAL LOW (ref 83.0–108.0)
pO2, Arterial: 42 mmHg — ABNORMAL LOW (ref 83.0–108.0)
pO2, Arterial: 43 mmHg — ABNORMAL LOW (ref 83.0–108.0)

## 2020-07-10 LAB — BASIC METABOLIC PANEL
Anion gap: 11 (ref 5–15)
BUN: 93 mg/dL — ABNORMAL HIGH (ref 8–23)
CO2: 20 mmol/L — ABNORMAL LOW (ref 22–32)
Calcium: 7.7 mg/dL — ABNORMAL LOW (ref 8.9–10.3)
Chloride: 108 mmol/L (ref 98–111)
Creatinine, Ser: 2.96 mg/dL — ABNORMAL HIGH (ref 0.61–1.24)
GFR calc Af Amer: 24 mL/min — ABNORMAL LOW (ref >60)
GFR calc non Af Amer: 21 mL/min — ABNORMAL LOW (ref >60)
Glucose, Bld: 351 mg/dL — ABNORMAL HIGH (ref 70–99)
Potassium: 5.2 mmol/L — ABNORMAL HIGH (ref 3.5–5.1)
Sodium: 139 mmol/L (ref 135–145)

## 2020-07-10 LAB — COMPREHENSIVE METABOLIC PANEL
ALT: 38 U/L (ref 0–44)
AST: 53 U/L — ABNORMAL HIGH (ref 15–41)
Albumin: 2.2 g/dL — ABNORMAL LOW (ref 3.5–5.0)
Alkaline Phosphatase: 99 U/L (ref 38–126)
Anion gap: 12 (ref 5–15)
BUN: 83 mg/dL — ABNORMAL HIGH (ref 8–23)
CO2: 21 mmol/L — ABNORMAL LOW (ref 22–32)
Calcium: 8.1 mg/dL — ABNORMAL LOW (ref 8.9–10.3)
Chloride: 115 mmol/L — ABNORMAL HIGH (ref 98–111)
Creatinine, Ser: 3.03 mg/dL — ABNORMAL HIGH (ref 0.61–1.24)
GFR calc Af Amer: 24 mL/min — ABNORMAL LOW (ref >60)
GFR calc non Af Amer: 20 mL/min — ABNORMAL LOW (ref >60)
Glucose, Bld: 152 mg/dL — ABNORMAL HIGH (ref 70–99)
Potassium: 5.2 mmol/L — ABNORMAL HIGH (ref 3.5–5.1)
Sodium: 148 mmol/L — ABNORMAL HIGH (ref 135–145)
Total Bilirubin: 0.6 mg/dL (ref 0.3–1.2)
Total Protein: 5 g/dL — ABNORMAL LOW (ref 6.5–8.1)

## 2020-07-10 LAB — GLUCOSE, CAPILLARY
Glucose-Capillary: 125 mg/dL — ABNORMAL HIGH (ref 70–99)
Glucose-Capillary: 118 mg/dL — ABNORMAL HIGH (ref 70–99)
Glucose-Capillary: 125 mg/dL — ABNORMAL HIGH (ref 70–99)
Glucose-Capillary: 294 mg/dL — ABNORMAL HIGH (ref 70–99)

## 2020-07-10 LAB — PHOSPHORUS: Phosphorus: 5.5 mg/dL — ABNORMAL HIGH (ref 2.5–4.6)

## 2020-07-10 LAB — MAGNESIUM
Magnesium: 2.5 mg/dL — ABNORMAL HIGH (ref 1.7–2.4)
Magnesium: 2.9 mg/dL — ABNORMAL HIGH (ref 1.7–2.4)

## 2020-07-10 LAB — D-DIMER, QUANTITATIVE: D-Dimer, Quant: >20 ug/mL-FEU — ABNORMAL HIGH (ref 0.00–0.50)

## 2020-07-10 LAB — HEPARIN LEVEL (UNFRACTIONATED): Heparin Unfractionated: >2.2 IU/mL — ABNORMAL HIGH (ref 0.30–0.70)

## 2020-07-10 MED ORDER — SODIUM ZIRCONIUM CYCLOSILICATE 10 G PO PACK
10.0000 g | PACK | Freq: Once | ORAL | Status: AC
  Administered 2020-07-10: 10 g via ORAL
  Filled 2020-07-10: qty 1

## 2020-07-10 MED ORDER — ENSURE ENLIVE PO LIQD: 237.0000 mL | Freq: Three times a day (TID) | ORAL | Status: DC

## 2020-07-10 MED ORDER — HEPARIN BOLUS VIA INFUSION
2500.0000 [IU] | Freq: Once | INTRAVENOUS | Status: AC
  Administered 2020-07-10: 2500 [IU] via INTRAVENOUS
  Filled 2020-07-10: qty 2500

## 2020-07-10 MED ORDER — HEPARIN (PORCINE) 25000 UT/250ML-% IV SOLN
1250.0000 [IU]/h | INTRAVENOUS | Status: DC
  Administered 2020-07-10: 1250 [IU]/h via INTRAVENOUS
  Filled 2020-07-10 (×2): qty 250

## 2020-07-10 MED ORDER — METOPROLOL TARTRATE 5 MG/5ML IV SOLN: 2.5000 mg | INTRAVENOUS | Status: DC | PRN

## 2020-07-10 MED ORDER — HEPARIN (PORCINE) 25000 UT/250ML-% IV SOLN
1100.0000 [IU]/h | INTRAVENOUS | Status: DC
  Administered 2020-07-11: 900 [IU]/h via INTRAVENOUS
  Administered 2020-07-12: 700 [IU]/h via INTRAVENOUS
  Administered 2020-07-13: 650 [IU]/h via INTRAVENOUS
  Filled 2020-07-10 (×3): qty 250

## 2020-07-10 MED ORDER — AMIODARONE HCL IN DEXTROSE 360-4.14 MG/200ML-% IV SOLN
30.0000 mg/h | INTRAVENOUS | Status: DC
  Administered 2020-07-11: 30 mg/h via INTRAVENOUS
  Filled 2020-07-10 (×3): qty 200

## 2020-07-10 MED ORDER — SODIUM CHLORIDE 0.45 % IV SOLN: INTRAVENOUS | Status: DC

## 2020-07-10 MED ORDER — QUETIAPINE FUMARATE 50 MG PO TABS
50.0000 mg | ORAL_TABLET | Freq: Two times a day (BID) | ORAL | Status: DC
  Filled 2020-07-10: qty 1

## 2020-07-10 MED ORDER — AMIODARONE LOAD VIA INFUSION
150.0000 mg | Freq: Once | INTRAVENOUS | Status: AC
  Administered 2020-07-10: 150 mg via INTRAVENOUS
  Filled 2020-07-10: qty 83.34

## 2020-07-10 MED ORDER — AMIODARONE HCL IN DEXTROSE 360-4.14 MG/200ML-% IV SOLN
60.0000 mg/h | INTRAVENOUS | Status: AC
  Administered 2020-07-10 – 2020-07-11 (×2): 60 mg/h via INTRAVENOUS
  Filled 2020-07-10: qty 200

## 2020-07-10 MED ORDER — DEXTROSE-NACL 5-0.9 % IV SOLN: INTRAVENOUS | Status: DC

## 2020-07-10 NOTE — Progress Notes (Signed)
ANTICOAGULATION CONSULT NOTE   Pharmacy Consult:  Eliquis >> Heparin Indication: DVT  No Known Allergies  Patient Measurements: Height: 5\' 7"  (170.2 cm) Weight: 101.3 kg (223 lb 5.2 oz) IBW/kg (Calculated) : 66.1 Heparin Dosing Weight: 88 kg  Vital Signs: Temp: 96.6 F (35.9 C) (08/04 2038) Temp Source: Axillary (08/04 2038) BP: 92/53 (08/04 2230) Pulse Rate: 55 (08/04 2309)  Labs: Recent Labs    07/09/20 0747 07/10/20 0306 07/10/20 0313 07/10/20 0313 07/10/20 2156 07/10/20 2156 07/10/20 2219 07/10/20 2226  HGB 12.5*   < > 12.4*   < > 12.7*   < > 11.6* 11.6*  HCT 38.4*   < > 37.7*   < > 40.1  --  34.0* 34.0*  PLT 115*  --  101*  --  94*  --   --   --   APTT  --   --   --   --  >200*  --   --   --   HEPARINUNFRC  --   --   --   --  >2.20*  --   --   --   CREATININE 2.64*  --  3.03*  --  2.96*  --   --   --    < > = values in this interval not displayed.    Estimated Creatinine Clearance: 27.5 mL/min (A) (by C-G formula based on SCr of 2.96 mg/dL (H)).   Medical History: History reviewed. No pertinent past medical history.   Assessment: 75 YOM presented with hypoxia and confusion in setting of Covid-19.  Patient transferred to the ICU due to respiratory distress.  He has been on Eliquis for acute bilateral DVT, last dose on 07/09/20 AM.  Pharmacy consulted to transition patient to IV heparin due to NPO status/AMS.  Patient has worsening AKI. CBC stable - platelet count low at 101K.  D-dimer elevated.    Heparin level >2.2 (due to Eliquis), PTT >200 sec (supratherapeutic) on gtt at 1250 units/hr. Confirmed that labs drawn from arm opposite where heparin running. No bleeding reported.  Goal of Therapy:  Heparin level 0.3-0.7 units/ml aPTT 66 - 102 seconds Monitor platelets by anticoagulation protocol: Yes   Plan:  Hold heparin x 1 hour Restart heparin gtt at reduced rate of  Heparin gtt at 900 units/hr Check 8 hr PTT  09/08/20, PharmD, BCPS Please see  amion for complete clinical pharmacist phone list 07/10/2020, 11:29 PM

## 2020-07-10 NOTE — Progress Notes (Signed)
Patient's daughter, Milbern Doescher, updated on patient status via phone. All questions/concerns addressed.   Eliezer Bottom, MD Internal Medicine, PGY-2 07/10/20 2:14 PM Pager # [phone]

## 2020-07-10 NOTE — Evaluation (Signed)
Clinical/Bedside Swallow Evaluation Patient Details  Name: Gregory [name] MRN: [id number] Date of Birth: 19-May-1951  Today's Date: 07/10/2020 Time: SLP Start Time (ACUTE ONLY): 1544 SLP Stop Time (ACUTE ONLY): 1600 SLP Time Calculation (min) (ACUTE ONLY): 16 min  Past Medical History: History reviewed. No pertinent past medical history. Past Surgical History:  The histories are not reviewed yet. Please review them in the "History" navigator section and refresh this SmartLink. HPI:  69 yo presented to ED on July 16, 2020 with hypoxia on NRB to 15L and confusion. Covid test positive 7/28. Code sepsis initiated, Admitted to ICU.  No PMHx on file. CXR Diffuse airspace disease throughout both lungs with air bronchograms. This likely represents diffuse pneumonia given the   Assessment / Plan / Recommendation Clinical Impression  Pt's swallow abilitites assessed and diagnosed with Covid 7/28. He is currently on HFNC 100% and respirations were stable without increased effort during eval. Consumed approximately 2 oz water via straw x 2 without s/s aspiration or respiratory distress. Mastication pattern lacked efficient rotary movement with lingual residue. He coughed with mixed consistency of cracker and thin. Recommend Dys 2 (minced texture), thin liquids, pills whole in puree and rest breaks if needed. ST will follow for upgrade.    SLP Visit Diagnosis: Dysphagia, unspecified (R13.10)    Aspiration Risk  Mild aspiration risk    Diet Recommendation Dysphagia 2 (Fine chop);Thin liquid   Liquid Administration via: Straw;Cup Medication Administration: Whole meds with puree Supervision: Patient able to self feed;Full supervision/cueing for compensatory strategies Compensations: Minimize environmental distractions;Slow rate;Small sips/bites;Lingual sweep for clearance of pocketing Postural Changes: Seated upright at 90 degrees    Other  Recommendations Oral Care Recommendations: Oral care BID   Follow up  Recommendations None      Frequency and Duration min 2x/week  2 weeks       Prognosis Prognosis for Safe Diet Advancement: Good      Swallow Study   General Date of Onset: 07/07/2020 HPI: 69 yo presented to ED on July 16, 2020 with hypoxia on NRB to 15L and confusion. Covid test positive 7/28. Code sepsis initiated, Admitted to ICU.  No PMHx on file. CXR Diffuse airspace disease throughout both lungs with air bronchograms. This likely represents diffuse pneumonia given the Type of Study: Bedside Swallow Evaluation Previous Swallow Assessment:  (none) Diet Prior to this Study: NPO Temperature Spikes Noted: No Respiratory Status: Other (comment) (HFNC 100%) History of Recent Intubation: No Behavior/Cognition: Alert;Cooperative;Pleasant mood;Confused;Requires cueing Oral Cavity Assessment: Dry Oral Care Completed by SLP: Recent completion by staff Oral Cavity - Dentition: Adequate natural dentition Vision: Functional for self-feeding Self-Feeding Abilities: Able to feed self;Needs set up Patient Positioning: Upright in bed Baseline Vocal Quality: Normal Volitional Cough: Weak Volitional Swallow: Able to elicit    Oral/Motor/Sensory Function Overall Oral Motor/Sensory Function: Within functional limits   Ice Chips Ice chips: Not tested   Thin Liquid Thin Liquid: Impaired Presentation: Straw Pharyngeal  Phase Impairments: Cough - Delayed (only with mixed consistency)    Nectar Thick Nectar Thick Liquid: Not tested   Honey Thick Honey Thick Liquid: Not tested   Puree Puree: Within functional limits   Solid     Solid: Impaired Oral Phase Impairments: Reduced lingual movement/coordination Oral Phase Functional Implications: Impaired mastication;Prolonged oral transit      Royce Macadamia 07/10/2020,4:29 PM  .[email or web address]

## 2020-07-10 NOTE — Progress Notes (Addendum)
PROGRESS NOTE    Gregory [name]  ZOX:[id number] DOB: [birth year] DOA: 07/04/2020 PCP: [facility]   Brief Narrative:  69 yo BM   presented to ED on 06/06/2020 with hypoxia on NRB to 15L and confusion, unable to give history. Code sepsis initiated, s/p 2.25L NS fluid resuscitation. Admitted to ICU.Patient brought to ED after daughter (who lives out of state) called EMS because her father sounded confused on the phone. Per EDP's note, patient reportedly had SpO2 in 40s at home.  Initially patient with significant oxygen requirement, he was admitted to ICU, for which he required heated high flow nasal cannula, overall he has been stabilized however he was transferred to progressive care 8/1, patient was diagnosed with acute bilateral DVT, overall patient has been refusing to take his medication, paranoid, patient with increased oxygen requirement this morning for which PCCM were consulted, where he will be transferred back to ICU for better care, where he is high risk for intubation.   Subjective: Patient responded to painful stimuli however babble incoherently.  Per RN carried on a conversation with her earlier in the day, but still not fully coherent.   Assessment & Plan:  Covid vaccination; no vaccination  Active Problems:   Sepsis (HCC)   Hypoxia   Shortness of breath   Acute respiratory disease due to COVID-19 virus   Acute hypoxic respiratory failure due to COVID-19 pneumonia -This morning he is with worsening mental status, more lethargic, as well more hypoxic currently requiring 50 L heated high flow nasal cannula, and 15L NRB . -Continue with IV steroids, I have increased his Decadron to 10 mg IV daily -He was treated with Remdesivir -7/28 received Actemra  -He received IV Lasix today -Continue to monitor inflammatory markers closely -Prone patient for 8 hours/day, the patient does not tolerate prone 2 to 3 hours per shift -8/5 PCXR pending -8/5 ABG  pending -Patient is more lethargic, significantly increased oxygen requirement, PCCM consulted, he will be transferred to ICU, he is high risk for intubation.  Neck " -8/4 patient more alert after proning for several minutes.  Spoke with RN Selena Batten who has ordered swallow evaluation for patient. -Patient at high risk for requiring HHFNC or intubation   COVID-19 Labs  Recent Labs    07/08/20 0419 07/09/20 0747 07/10/20 0313  DDIMER >20.00* >20.00* >20.00*  FERRITIN 2,174*  --   --   CRP 1.6*  --   --     Lab Results  Component Value Date   SARSCOV2NAA POSITIVE (A) 06/10/2020    Acute on chronic renal failure, unclear baseline. Recent Labs  Lab 07/06/20 0846 07/07/20 0726 07/08/20 0419 07/09/20 0747 07/10/20 0313  CREATININE 3.07* 3.11* 2.82* 2.64* 3.03*  -Most likely secondary to Covid infection.  In addition to diuretic/ARB use. -[facility] no [facility] of his baseline creatinine. -See moderate LVH  Moderate LVH -Strict in and out -Daily weight -Gentle hydration D5-0.9% saline 134ml/hr  Essential hypertension -Clonidine 0.1 mg PRN  -Patient mildly bradycardic but currently on no medication that should cause or exacerbate this condition.  Monitor closely.  BP stable  Acute DVT -8/4 DC Eliquis patient refusing -8/4 start heparin drip will need to monitor platelets carefully as thrombocytopenic (101)  Paranoia/encephalopathy -Patient significantly paranoid, noncompliant with medication,he is clearly have significant lack of understanding of his disease, and no understanding of the treatment process, been refusing most of his meds initially. -He is on as needed Haldol for agitation, especially pulling  off his nasal cannula multiple times .   DM type II uncontrolled with complication -With A1c of 11.3 -Poorly controlled, now he is n.p.o., will decrease his Levemir to 10 units twice daily, continue with sliding scale .  Hyperkalemia -Lokelma  10 g x 1  ADDENDUM 8/5 Palliative Care Consult; Pt with High probability of not surviving this hospitalization. Discuss change of Status to DNR. Discus Advance Directives   DVT prophylaxis: Heparin drip Code Status: Full Family Communication: 8/4 spoke with [place] (daughter) discussed plan of care answered all questions Status is: Inpatient    Dispo: The patient is from: Home              Anticipated d/c is to:??              Anticipated d/c date is:??              Patient currently extremely unstable      Consultants:  Nephrology   Procedures/Significant Events:  7/29 echocardiogram;Left Ventricle: Left ventricular ejection fraction, by estimation, is 55  to 60%. The left ventricle has normal function. The left ventricle has no  regional wall motion abnormalities. The left ventricular internal cavity  size was normal in size. There is  moderate left ventricular hypertrophy. Left ventricular diastolic  parameters are indeterminate.   Right Ventricle: The right ventricular size is mildly enlarged. Right  vetricular wall thickness was not assessed. Right ventricular systolic  function is normal. Tricuspid regurgitation signal is inadequate for  assessing PA pressure.   Left Atrium: Left atrial size was normal in size.   Right Atrium: Right atrial size was not well visualized.   Pericardium: There is no evidence of pericardial effusion.   Mitral Valve: The mitral valve is normal in structure. No evidence of  mitral valve regurgitation.   Tricuspid Valve: The tricuspid valve is grossly normal. Tricuspid valve  regurgitation is not demonstrated.   Aortic Valve: The aortic valve was not well visualized. Aortic valve  regurgitation is not visualized. No aortic stenosis is present.   Pulmonic Valve: The pulmonic valve was not well visualized. Pulmonic valve  regurgitation is not visualized.   Aorta: The aortic root is normal in size and structure.   Venous: The  inferior vena cava is dilated in size with greater than 50%  respiratory variability, suggesting right atrial pressure of 8 mmHg.   IAS/Shunts: The interatrial septum was not well visualized.    I have personally reviewed and interpreted all radiology studies and my findings are as above.  VENTILATOR SETTINGS: HFNC/NRB 8/4 FiO2 100% Flow; 45% SPO2 85%   Cultures   Antimicrobials: Anti-infectives (From admission, onward)   Start     Ordered Stop   07/04/20 1000  remdesivir 100 mg in sodium chloride 0.9 % 100 mL IVPB       "Followed by" Linked Group Details   06/07/2020 1953 07/08/20 1024   06/06/2020 2000  remdesivir 200 mg in sodium chloride 0.9% 250 mL IVPB       "Followed by" Linked Group Details   06/12/2020 1953 06/19/2020 2107   06/26/2020 1445  vancomycin (VANCOREADY) IVPB 2000 mg/400 mL        07/04/2020 1431 06/18/2020 1827   06/16/2020 1415  ceFEPIme (MAXIPIME) 2 g in sodium chloride 0.9 % 100 mL IVPB        07/02/2020 1411 06/27/2020 1530   06/07/2020 1415  metroNIDAZOLE (FLAGYL) IVPB 500 mg        06/22/2020 1411 06/17/2020  1601   06/26/2020 1415  vancomycin (VANCOCIN) IVPB 1000 mg/200 mL premix  Status:  Discontinued        06/06/2020 1411 06/13/2020 1601       Devices    LINES / TUBES:      Continuous Infusions: . dexmedetomidine (PRECEDEX) IV infusion 0.6 mcg/kg/hr (07/10/20 0451)     Objective: Vitals:   07/10/20 0300 07/10/20 0359 07/10/20 0400 07/10/20 0443  BP: 115/87  113/69   Pulse: 74  65 61  Resp: 20  (!) 21 18  Temp:   98.9 F (37.2 C)   TempSrc:   Oral   SpO2: 97%  92% 94%  Weight:  101.3 kg    Height:        Intake/Output Summary (Last 24 hours) at 07/10/2020 0741 Last data filed at 07/10/2020 0400 Gross per 24 hour  Intake 200 ml  Output 2650 ml  Net -2450 ml   Filed Weights   07/08/20 0551 07/09/20 0352 07/10/20 0359  Weight: (!) 102.4 kg 101.6 kg 101.3 kg    Examination:  General: Wakes to painful stimuli but babbles incoherently, positive acute  respiratory distress Eyes: negative scleral hemorrhage, negative anisocoria, negative icterus ENT: Negative Runny nose, negative gingival bleeding, Neck:  Negative scars, masses, torticollis, lymphadenopathy, JVD Lungs: significantly decreased breath sounds diffusely, without wheezes or crackles Cardiovascular: Regular rate and rhythm without murmur gallop or rub normal S1 and S2 Abdomen: negative abdominal pain, nondistended, positive soft, bowel sounds, no rebound, no ascites, no appreciable mass Extremities: No significant cyanosis, clubbing, or edema bilateral lower extremities Skin: Negative rashes, lesions, ulcers Psychiatric: Unable to evaluate secondary to patient's acute respiratory failure Central nervous system: Unable to evaluate secondary to patient's acute respiratory failure .     Data Reviewed: Care during the described time interval was provided by me .  I have reviewed this patient's available data, including medical history, events of note, physical examination, and all test results as part of my evaluation.   CBC: Recent Labs  Lab 07/04/20 0720 07/04/20 0720 07/05/20 0711 07/05/20 0711 07/06/20 0846 07/06/20 0846 07/07/20 0726 07/08/20 0419 07/09/20 0747 07/10/20 0306 07/10/20 0313  WBC 3.3*   < > 7.1   < > 10.4  --  14.4* 15.9* 11.5*  --  7.7  NEUTROABS 2.9  --  5.6  --  8.1*  --  10.9* 13.7*  --   --   --   HGB 10.6*   < > 11.0*   < > 12.0*   < > 12.2* 12.3* 12.5* 11.2* 12.4*  HCT 33.1*   < > 34.4*   < > 36.6*   < > 37.5* 37.6* 38.4* 33.0* 37.7*  MCV 91.2   < > 89.8   < > 88.4  --  87.8 89.1 86.9  --  88.7  PLT 150   < > 189   < > 186  --  132* 126* 115*  --  101*   < > = values in this interval not displayed.   Basic Metabolic Panel: Recent Labs  Lab 07/05/20 0711 07/05/20 1420 07/06/20 0846 07/06/20 0846 07/07/20 0726 07/08/20 0419 07/09/20 0747 07/10/20 0306 07/10/20 0313  NA 142   < > 143   < > 142 140 143 149* 148*  K 5.4*   < > 4.8   < >  4.8 5.4* 4.6 5.3* 5.2*  CL 112*   < > 113*  --  112* 111 115*  --  115*  CO2 19*   < > 20*  --  21* 19* 18*  --  21*  GLUCOSE 337*   < > 305*  --  249* 391* 231*  --  152*  BUN 62*   < > 73*  --  87* 87* 82*  --  83*  CREATININE 2.99*   < > 3.07*  --  3.11* 2.82* 2.64*  --  3.03*  CALCIUM 8.0*   < > 8.2*  --  8.0* 7.8* 8.0*  --  8.1*  MG 2.4  --  2.6*  --  2.6* 2.6*  --   --  2.5*  PHOS 4.1  --  4.3  --  5.0* 4.8*  --   --  5.5*   < > = values in this interval not displayed.   GFR: Estimated Creatinine Clearance: 26.8 mL/min (A) (by C-G formula based on SCr of 3.03 mg/dL (H)). Liver Function Tests: Recent Labs  Lab 07/06/20 0846 07/07/20 0726 07/08/20 0419 07/09/20 0747 07/10/20 0313  AST 54* 50* 33 46* 53*  ALT 72* 60* 45* 41 38  ALKPHOS 70 87 95 115 99  BILITOT 0.7 0.8 0.8 0.9 0.6  PROT 6.3* 5.3* 5.0* 5.2* 5.0*  ALBUMIN 1.9* 2.1* 2.1* 2.1* 2.2*   No results for input(s): LIPASE, AMYLASE in the last 168 hours. No results for input(s): AMMONIA in the last 168 hours. Coagulation Profile: Recent Labs  Lab 2020-07-13 1415  INR 1.0   Cardiac Enzymes: Recent Labs  Lab 07/13/2020 1830  CKTOTAL 876*   BNP (last 3 results) No results for input(s): PROBNP in the last 8760 hours. HbA1C: No results for input(s): HGBA1C in the last 72 hours. CBG: Recent Labs  Lab 07/08/20 2047 07/09/20 0722 07/09/20 1139 07/09/20 1610 07/09/20 2110  GLUCAP 135* 161*  161* 226* 178* 161*   Lipid Profile: No results for input(s): CHOL, HDL, LDLCALC, TRIG, CHOLHDL, LDLDIRECT in the last 72 hours. Thyroid Function Tests: No results for input(s): TSH, T4TOTAL, FREET4, T3FREE, THYROIDAB in the last 72 hours. Anemia Panel: Recent Labs    07/08/20 0419  FERRITIN 2,174*   Urine analysis:    Component Value Date/Time   COLORURINE YELLOW Jul 13, 2020 1615   APPEARANCEUR CLOUDY (A) 2020-07-13 1615   LABSPEC 1.019 2020-07-13 1615   PHURINE 5.0 Jul 13, 2020 1615   GLUCOSEU 150 (A) 07-13-2020  1615   HGBUR MODERATE (A) 07/13/20 1615   BILIRUBINUR NEGATIVE July 13, 2020 1615   KETONESUR NEGATIVE 07-13-2020 1615   PROTEINUR >=300 (A) Jul 13, 2020 1615   NITRITE NEGATIVE 2020/07/13 1615   LEUKOCYTESUR NEGATIVE 2020-07-13 1615   Sepsis Labs: @LABRCNTIP (procalcitonin:4,lacticidven:4)  ) Recent Results (from the past 240 hour(s))  Blood Culture (routine x 2)     Status: None   Collection Time: 2020-07-13  2:10 PM   Specimen: BLOOD  Result Value Ref Range Status   Specimen Description BLOOD RIGHT ANTECUBITAL  Final   Special Requests   Final    BOTTLES DRAWN AEROBIC AND ANAEROBIC Blood Culture results may not be optimal due to an inadequate volume of blood received in culture bottles   Culture   Final    NO GROWTH 5 DAYS Performed at [facility], [address], [place], [place]    Report Status 07/08/2020 FINAL  Final  Blood Culture (routine x 2)     Status: None   Collection Time: July 13, 2020  2:38 PM   Specimen: BLOOD  Result Value Ref Range Status   Specimen Description BLOOD LEFT  ANTECUBITAL  Final   Special Requests   Final    BOTTLES DRAWN AEROBIC ONLY Blood Culture results may not be optimal due to an inadequate volume of blood received in culture bottles   Culture   Final    NO GROWTH 5 DAYS Performed at [facility], [address], [place], [place] [address]    Report Status 07/08/2020 FINAL  Final  SARS Coronavirus 2 by RT PCR (hospital order, performed in [facility] hospital lab) Nasopharyngeal Nasopharyngeal Swab     Status: Abnormal   Collection Time: 2020-07-28  2:47 PM   Specimen: Nasopharyngeal Swab  Result Value Ref Range Status   SARS Coronavirus 2 POSITIVE (A) NEGATIVE Final    Comment: RESULT CALLED TO, READ BACK BY AND VERIFIED WITH: Della Goo MD @1954  Jul 28, 2020 EB K,SPRANGERS RN @1957  July 28, 2020 EB (NOTE) SARS-CoV-2 target nucleic acids are DETECTED  SARS-CoV-2 RNA is generally detectable in upper respiratory specimens    during the acute phase of infection.  Positive results are indicative  of the presence of the identified virus, but do not rule out bacterial infection or co-infection with other pathogens not detected by the test.  Clinical correlation with patient history and  other diagnostic information is necessary to determine patient infection status.  The expected result is negative.  Fact Sheet for Patients:     Fact Sheet for Healthcare Providers:   07/05/20    This test is not yet approved or cleared by the [employer] FDA and  has been authorized for detection and/or diagnosis of SARS-CoV-2 by FDA under an Emergency Use Authorization (EUA).  This EUA will  remain in effect (meaning this test can be used) for the duration of  the COVID-19 declaration under Section 564(b)(1) of the Act, 21 U.S.C. section 360-bbb-3(b)(1), unless the authorization is terminated or revoked sooner.  Performed at [facility], [address], [place], [address] [place]   Urine culture     Status: Abnormal   Collection Time: July 28, 2020  4:15 PM   Specimen: In/Out Cath Urine  Result Value Ref Range Status   Specimen Description IN/OUT CATH URINE  Final   Special Requests NONE  Final   Culture (A)  Final    <10,000 COLONIES/mL INSIGNIFICANT GROWTH Performed at [facility], [address], [place], [address] [place]    Report Status 07/04/2020 FINAL  Final  MRSA PCR Screening     Status: None   Collection Time: 07/04/20  4:07 AM   Specimen: Nasal Mucosa; Nasopharyngeal  Result Value Ref Range Status   MRSA by PCR NEGATIVE NEGATIVE Final    Comment:        The GeneXpert MRSA Assay (FDA approved for NASAL specimens only), is one component of a comprehensive MRSA colonization surveillance program. It is not intended to diagnose MRSA infection nor to guide or monitor treatment for MRSA infections. Performed at  [facility], [address] [place], [address] [place]          Radiology Studies: DG Chest Port 1 View  Result Date: 07/10/2020 CLINICAL DATA:  Abnormal respiration.  COVID positive inpatient EXAM: PORTABLE CHEST 1 VIEW COMPARISON:  07/09/2020 FINDINGS: Diffuse airspace disease throughout both lungs with air bronchograms. This likely represents diffuse pneumonia given the history but edema could also have this appearance. No pleural effusions. No pneumothorax. Mild cardiac enlargement. IMPRESSION: Diffuse bilateral airspace disease with air bronchograms. Progression since previous study. Electronically Signed  By: Burman Nieves M.D.   On: 07/10/2020 04:16   DG Chest Port 1 View  Result Date: 07/09/2020 CLINICAL DATA:  Shortness of breath with cough and congestion EXAM: PORTABLE CHEST 1 VIEW COMPARISON:  July 04, 2020 FINDINGS: There has been significant although incomplete partial clearing bilaterally compared to recent study. Ill-defined airspace opacity remains in portions of the lung bases. No new opacity evident. Heart size and pulmonary vascularity are normal. No adenopathy. No bone lesions. IMPRESSION: Significant partial clearing of airspace opacity bilaterally. Mild residual airspace opacity in the bases present. No new opacity evident. Cardiac silhouette within normal limits. No adenopathy. Electronically Signed   By: Bretta Bang III M.D.   On: 07/09/2020 11:32        Scheduled Meds: . apixaban  10 mg Oral BID   Followed by  . [START ON 07/15/2020] apixaban  5 mg Oral BID  . chlorhexidine  15 mL Mouth Rinse BID  . Chlorhexidine Gluconate Cloth  6 each Topical Q0600  . dexamethasone (DECADRON) injection  10 mg Intravenous Q24H  . docusate sodium  100 mg Oral BID  . insulin aspart  0-20 Units Subcutaneous TID WC  . insulin aspart  0-5 Units Subcutaneous QHS  . insulin aspart  6 Units Subcutaneous TID WC  . insulin detemir  10 Units Subcutaneous BID  . mouth  rinse  15 mL Mouth Rinse q12n4p  . pantoprazole  40 mg Oral Daily  . sodium chloride flush  10-40 mL Intracatheter Q12H  . sodium chloride flush  3 mL Intravenous Q12H   Continuous Infusions: . dexmedetomidine (PRECEDEX) IV infusion 0.6 mcg/kg/hr (07/10/20 0451)     LOS: 7 days   The patient is critically ill with multiple organ systems failure and requires high complexity decision making for assessment and support, frequent evaluation and titration of therapies, application of advanced monitoring technologies and extensive interpretation of multiple databases. Critical Care Time devoted to patient care services described in this note  Time spent: 40 minutes     Tallen Schnorr, Roselind Messier, MD [facility] Pager [phone]  If 7PM-7AM, please contact night-coverage [email or web address] Password [facility] 07/10/2020, 7:41 AM

## 2020-07-10 NOTE — Progress Notes (Addendum)
eLink Physician-Brief Progress Note Patient Name: Gregory [name] DOB: 15-Dec-1950 MRN: [id number]   Date of Service  07/10/2020  HPI/Events of Note  Intermittent agitation due to which he pulls off his mask and desaturates. Patient has been on 100% NRB mask along with 100%/40 liter HFNC all day, also had restraints on earlier. RN requesting order for restraints again and also assessment of tachycardia. Intermittent P waves noted on monitor. Looks agitated on camera (no change from prior) and is on precedex  eICU Interventions  EKG, obtain CBC BMP and mag now RN also noted issues with sending off labs, has called in a 4rd person for PTT draw so will attempt all these together Also check ABG RN will call back when these are done      Intervention Category Major Interventions: Arrhythmia - evaluation and management;Other:  Cletis Clack G Myrka Sylva 07/10/2020, 8:16 PM   10 pm - EKG done, A fib (needs to be scanned into epic but A fib also on monitor) IV metoprolol ordered ABG and labs pending, RN to call us with results  11:15 pm Issue with scanning EKG in system Qtc is ok MAP is just 66  Patient asleep, no distress while on precedex infusion< o2 sat is 90  Will start IV amiodarone for A fib given borderline Bps RN also pointed out patient is on D5NS at 100 cc/hour ?, glucose is 300 Will remove the external D5 and switch to 1/2 NS instead I reviewed CBC BMP and Mag and ABG  Repeat labs in AM   1.05 am Called with sharp desaturation O2 sat 70, patient lethargic and also hypotensive Needs to be intubated Notified ground team and Dr Everardo All in room promptly getting set to intubate   3.30 am Notified of ABG Increase RR to 22 Lower fio2 to 90, peep to 14, O2 sat is 100 Repeat ABG at 5 am Have also asked that AM labs be sent now along with a lactate Patient is currently off levophed

## 2020-07-10 NOTE — Progress Notes (Signed)
ANTICOAGULATION CONSULT NOTE - Initial Consult  Pharmacy Consult:  Eliquis >> Heparin Indication: DVT  No Known Allergies  Patient Measurements: Height: 5\' 7"  (170.2 cm) Weight: 101.3 kg (223 lb 5.2 oz) IBW/kg (Calculated) : 66.1 Heparin Dosing Weight: 88 kg  Vital Signs: Temp: 98.9 F (37.2 C) (08/04 0400) Temp Source: Oral (08/04 0400) BP: 113/69 (08/04 0400) Pulse Rate: 61 (08/04 0443)  Labs: Recent Labs    07/08/20 0419 07/08/20 0419 07/09/20 0747 07/09/20 0747 07/10/20 0306 07/10/20 0313  HGB 12.3*   < > 12.5*   < > 11.2* 12.4*  HCT 37.6*   < > 38.4*  --  33.0* 37.7*  PLT 126*  --  115*  --   --  101*  CREATININE 2.82*  --  2.64*  --   --  3.03*   < > = values in this interval not displayed.    Estimated Creatinine Clearance: 26.8 mL/min (A) (by C-G formula based on SCr of 3.03 mg/dL (H)).   Medical History: History reviewed. No pertinent past medical history.   Assessment: 55 YOM presented with hypoxia and confusion in setting of Covid-19.  Patient transferred to the ICU due to respiratory distress.  He has been on Eliquis for acute bilateral DVT, last dose on 07/09/20 AM.  Pharmacy consulted to transition patient to IV heparin due to NPO status/AMS.  Patient has worsening AKI.  CBC stable - platelet count low at 101K.  D-dimer elevated.  No bleeding reported.  Goal of Therapy:  Heparin level 0.3-0.7 units/ml aPTT 66 - 102 seconds Monitor platelets by anticoagulation protocol: Yes   Plan:  Heparin 2500 units IV bolus (reduced d/t low plts and recent Eliquis) Heparin gtt at 1250 units/hr Check 8 hr heparin level and aPTT Daily heparin level, aPTT and CBC  Nanette Wirsing D. 09/08/20, PharmD, BCPS, BCCCP 07/10/2020, 8:12 AM

## 2020-07-10 NOTE — Progress Notes (Signed)
PT Cancellation Note  Patient Details Name: Bonham Zingale MRN: [id number] DOB: Jan 11, 1951   Cancelled Treatment:    Reason Eval/Treat Not Completed: Medical issues which prohibited therapy.  Pt not tolerant of activity today.  Will attempt as able 8/5. 07/10/2020  Jacinto Halim., PT Acute Rehabilitation Services [phone]  (pager) [phone]  (office)   Eliseo Gum Marrianne Sica 07/10/2020, 4:02 PM

## 2020-07-10 NOTE — Progress Notes (Signed)
NAME:  Reginal Wojcicki, MRN:  [id number], DOB:  1951-03-02, LOS: 7 ADMISSION DATE:  07-17-20, CONSULTATION DATE:  07/09/2020 REFERRING MD:  Elgergawy, CHIEF COMPLAINT:  SOB, hypoxemia   Brief History   69 year old male with history of diabetes mellitus, hypertension, gout and CVA without focal deficits presented on 7/28 with hypoxia on non-rebreather. Code sepsis initiated s/p fluid resuscitation. Admitted to ICU for acute hypoxic respiratory failure 2/2 COVID pneumonia.   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 Asthma   Significant Hospital Events   7/29 admit to ICU 7/31 transfer to floor 8/3 transferred to ICU for worsening hypoxia   Consults:  PCCM  Procedures:  None  Significant Diagnostic Tests:  CXR 7/28> Diffuse bilateral airspace disease, pneumonia v edema CXR 8/4 > Diffuse bilateral airspace disease with air bronchograms, progression since prior study   Micro Data:  COVID positive 7/28 Blood cx 7/28 > negative  Urine cx 7/28> negative  MRSA 7/29 > negative   Antimicrobials:  Vancomycin 7/28 Cefepime 7/28 Flagyl 7/28 Remdesivir 7/28> 8/2 Tovilizumab 7/28  Interim history/subjective:  Patient transferred back to ICU yesterday in setting of increasing oxygen requirements. Overnight, patient started on precedex for agitation and confusion.   Objective   Blood pressure 113/69, pulse 61, temperature 98.9 F (37.2 C), temperature source Oral, resp. rate 18, height 5\' 7"  (1.702 m), weight 101.3 kg, SpO2 94 %.    FiO2 (%):  [90 %-100 %] 100 %   Intake/Output Summary (Last 24 hours) at 07/10/2020 0647 Last data filed at 07/10/2020 0400 Gross per 24 hour  Intake 200 ml  Output 2950 ml  Net -2750 ml   Filed Weights   07/08/20 0551 07/09/20 0352 07/10/20 0359  Weight: (!) 102.4 kg 101.6 kg 101.3 kg    Examination: General: Elderly appearing gentleman, no acute distress on HFNC and NRB  HENT: [place]/AT,  anicteric sclerae, EOMI, PERRL, dry mucus membranes,  Lungs: decreased breath sounds diffusely  Cardiovascular: RRR, no m/r/g Abdomen: nondistended, soft, nontender, normoactive bowel sounds Extremities: warm/dry, nonedematous  Neuro: awake, alert to self only; able to follow commands; no apparent focal deficits noted  Resolved Hospital Problem list    Assessment & Plan:  Acute hypoxic respiratory failure secondary to COVID 19 pneumonia Patient is currently on HFNC 35L and NRB 100% FiO2 and saturating 97-100%. Work of breathing at baseline currently, no accessory muscle use.  - Has completed Remdesivir on 8/1; continued on Decadron 10mg  daily until 8/6 - Due to agitation and continuously removing NRB and HFNC, remains high risk for requiring intubation  Acute toxic metabolic encephalopathy Appears that has component of paranoia and treatment noncompliance at baseline; requiring precedex gtt for agitation overnight  Encephalopathy could be exacerbated in setting of ICU delirium and steroid use  - Delirium precautions  - Seroquel 50mg  bid; wean precedex gtt as tolerated  - Haldol 1mg  q12h prn   Acute on chronic renal insufficiency  Hypernatremia  Hyperkalemia Unknown baseline but patient may have component of chronic kidney disease;  Renal function was slowly improving. Received Lasix yesterday with slightly worsening sCr this morning. Continues to have good urine output.  - Hold diuresis today - Continue to monitor electrolytes and urine output  - Avoid nephrotoxic agents as able   Acute DVT Patient was started on Eliquis; however, refusing medications so will need heparin - Heparin dosing per pharmacy   Diabetes mellitus: Levemir 10U bid + SSI  Best practice:  Diet: Carb modified Pain/Anxiety/Delirium protocol (if indicated): n/a VAP protocol (if indicated): n/a DVT prophylaxis: Heparin GI prophylaxis: n/a  Glucose control: Levemir + SSI Mobility: up as  Tolerated  Code  Status: FULL  Family Communication: per primary Disposition: ICU  Labs    Critical care time: 40 minutes    Eliezer Bottom, MD Internal Medicine, PGY-2 07/10/20 8:15 AM Pager # [phone]

## 2020-07-11 ENCOUNTER — Inpatient Hospital Stay (HOSPITAL_COMMUNITY): Payer: No Typology Code available for payment source

## 2020-07-11 DIAGNOSIS — A419 Sepsis, unspecified organism: Secondary | ICD-10-CM

## 2020-07-11 DIAGNOSIS — Z515 Encounter for palliative care: Secondary | ICD-10-CM

## 2020-07-11 DIAGNOSIS — G9340 Encephalopathy, unspecified: Secondary | ICD-10-CM

## 2020-07-11 DIAGNOSIS — Z7189 Other specified counseling: Secondary | ICD-10-CM

## 2020-07-11 DIAGNOSIS — J80 Acute respiratory distress syndrome: Secondary | ICD-10-CM

## 2020-07-11 DIAGNOSIS — R6521 Severe sepsis with septic shock: Secondary | ICD-10-CM

## 2020-07-11 LAB — COMPREHENSIVE METABOLIC PANEL
ALT: 31 U/L (ref 0–44)
AST: 51 U/L — ABNORMAL HIGH (ref 15–41)
Albumin: 2.1 g/dL — ABNORMAL LOW (ref 3.5–5.0)
Alkaline Phosphatase: 131 U/L — ABNORMAL HIGH (ref 38–126)
Anion gap: 11 (ref 5–15)
BUN: 88 mg/dL — ABNORMAL HIGH (ref 8–23)
CO2: 20 mmol/L — ABNORMAL LOW (ref 22–32)
Calcium: 7.9 mg/dL — ABNORMAL LOW (ref 8.9–10.3)
Chloride: 115 mmol/L — ABNORMAL HIGH (ref 98–111)
Creatinine, Ser: 2.64 mg/dL — ABNORMAL HIGH (ref 0.61–1.24)
GFR calc Af Amer: 28 mL/min — ABNORMAL LOW (ref >60)
GFR calc non Af Amer: 24 mL/min — ABNORMAL LOW (ref >60)
Glucose, Bld: 310 mg/dL — ABNORMAL HIGH (ref 70–99)
Potassium: 5.1 mmol/L (ref 3.5–5.1)
Sodium: 146 mmol/L — ABNORMAL HIGH (ref 135–145)
Total Bilirubin: 0.4 mg/dL (ref 0.3–1.2)
Total Protein: 5.1 g/dL — ABNORMAL LOW (ref 6.5–8.1)

## 2020-07-11 LAB — LACTIC ACID, PLASMA
Lactic Acid, Venous: 2.8 mmol/L (ref 0.5–1.9)
Lactic Acid, Venous: 2.5 mmol/L (ref 0.5–1.9)

## 2020-07-11 LAB — POCT I-STAT 7, (LYTES, BLD GAS, ICA,H+H)
Acid-base deficit: 8 mmol/L — ABNORMAL HIGH (ref 0.0–2.0)
Acid-base deficit: 7 mmol/L — ABNORMAL HIGH (ref 0.0–2.0)
Bicarbonate: 20.6 mmol/L (ref 20.0–28.0)
Bicarbonate: 20.3 mmol/L (ref 20.0–28.0)
Calcium, Ion: 1.22 mmol/L (ref 1.15–1.40)
Calcium, Ion: 1.14 mmol/L — ABNORMAL LOW (ref 1.15–1.40)
HCT: 36 % — ABNORMAL LOW (ref 39.0–52.0)
HCT: 35 % — ABNORMAL LOW (ref 39.0–52.0)
Hemoglobin: 12.2 g/dL — ABNORMAL LOW (ref 13.0–17.0)
Hemoglobin: 11.9 g/dL — ABNORMAL LOW (ref 13.0–17.0)
O2 Saturation: 98 %
O2 Saturation: 93 %
Patient temperature: 99
Potassium: 5 mmol/L (ref 3.5–5.1)
Potassium: 5.2 mmol/L — ABNORMAL HIGH (ref 3.5–5.1)
Sodium: 147 mmol/L — ABNORMAL HIGH (ref 135–145)
Sodium: 145 mmol/L (ref 135–145)
TCO2: 22 mmol/L (ref 22–32)
TCO2: 22 mmol/L (ref 22–32)
pCO2 arterial: 56.5 mmHg — ABNORMAL HIGH (ref 32.0–48.0)
pCO2 arterial: 47.8 mmHg (ref 32.0–48.0)
pH, Arterial: 7.17 — CL (ref 7.350–7.450)
pH, Arterial: 7.238 — ABNORMAL LOW (ref 7.350–7.450)
pO2, Arterial: 131 mmHg — ABNORMAL HIGH (ref 83.0–108.0)
pO2, Arterial: 82 mmHg — ABNORMAL LOW (ref 83.0–108.0)

## 2020-07-11 LAB — CBC
HCT: 39.6 % (ref 39.0–52.0)
Hemoglobin: 12.4 g/dL — ABNORMAL LOW (ref 13.0–17.0)
MCH: 28.4 pg (ref 26.0–34.0)
MCHC: 31.3 g/dL (ref 30.0–36.0)
MCV: 90.6 fL (ref 80.0–100.0)
Platelets: 113 10*3/uL — ABNORMAL LOW (ref 150–400)
RBC: 4.37 MIL/uL (ref 4.22–5.81)
RDW: 13.3 % (ref 11.5–15.5)
WBC: 5.4 10*3/uL (ref 4.0–10.5)
nRBC: 0.6 % — ABNORMAL HIGH (ref 0.0–0.2)

## 2020-07-11 LAB — GLUCOSE, CAPILLARY
Glucose-Capillary: 163 mg/dL — ABNORMAL HIGH (ref 70–99)
Glucose-Capillary: 163 mg/dL — ABNORMAL HIGH (ref 70–99)
Glucose-Capillary: 216 mg/dL — ABNORMAL HIGH (ref 70–99)
Glucose-Capillary: 256 mg/dL — ABNORMAL HIGH (ref 70–99)
Glucose-Capillary: 295 mg/dL — ABNORMAL HIGH (ref 70–99)

## 2020-07-11 LAB — PHOSPHORUS: Phosphorus: 7.3 mg/dL — ABNORMAL HIGH (ref 2.5–4.6)

## 2020-07-11 LAB — TRIGLYCERIDES: Triglycerides: 249 mg/dL — ABNORMAL HIGH (ref <150)

## 2020-07-11 LAB — APTT
aPTT: >200 seconds (ref 24–36)
aPTT: 98 seconds — ABNORMAL HIGH (ref 24–36)
aPTT: 125 seconds — ABNORMAL HIGH (ref 24–36)

## 2020-07-11 LAB — MAGNESIUM: Magnesium: 2.9 mg/dL — ABNORMAL HIGH (ref 1.7–2.4)

## 2020-07-11 MED ORDER — FENTANYL CITRATE (PF) 100 MCG/2ML IJ SOLN: 100.0000 ug | Freq: Once | INTRAMUSCULAR | Status: AC

## 2020-07-11 MED ORDER — ROCURONIUM BROMIDE 10 MG/ML (PF) SYRINGE
PREFILLED_SYRINGE | INTRAVENOUS | Status: AC
  Administered 2020-07-11: 100 mg
  Filled 2020-07-11: qty 10

## 2020-07-11 MED ORDER — POLYETHYLENE GLYCOL 3350 17 G PO PACK
17.0000 g | PACK | Freq: Every day | ORAL | Status: DC
  Administered 2020-07-12 – 2020-07-15 (×4): 17 g
  Filled 2020-07-11 (×4): qty 1

## 2020-07-11 MED ORDER — PROPOFOL 1000 MG/100ML IV EMUL
0.0000 ug/kg/min | INTRAVENOUS | Status: DC
  Administered 2020-07-11 (×2): 40 ug/kg/min via INTRAVENOUS
  Administered 2020-07-11 (×2): 20 ug/kg/min via INTRAVENOUS
  Administered 2020-07-11: 40 ug/kg/min via INTRAVENOUS
  Administered 2020-07-12: 10 ug/kg/min via INTRAVENOUS
  Administered 2020-07-12: 30 ug/kg/min via INTRAVENOUS
  Administered 2020-07-12: 20 ug/kg/min via INTRAVENOUS
  Administered 2020-07-13: 30 ug/kg/min via INTRAVENOUS
  Administered 2020-07-13: 20 ug/kg/min via INTRAVENOUS
  Administered 2020-07-13: 15 ug/kg/min via INTRAVENOUS
  Administered 2020-07-13: 40 ug/kg/min via INTRAVENOUS
  Administered 2020-07-14: 20 ug/kg/min via INTRAVENOUS
  Administered 2020-07-14 (×6): 50 ug/kg/min via INTRAVENOUS
  Administered 2020-07-15 (×3): 20 ug/kg/min via INTRAVENOUS
  Filled 2020-07-11 (×3): qty 100
  Filled 2020-07-11: qty 200
  Filled 2020-07-11 (×5): qty 100
  Filled 2020-07-11: qty 200
  Filled 2020-07-11 (×12): qty 100

## 2020-07-11 MED ORDER — AMIODARONE HCL IN DEXTROSE 360-4.14 MG/200ML-% IV SOLN
30.0000 mg/h | INTRAVENOUS | Status: DC
  Administered 2020-07-11 – 2020-07-12 (×3): 30 mg/h via INTRAVENOUS
  Filled 2020-07-11 (×3): qty 200

## 2020-07-11 MED ORDER — SEVELAMER CARBONATE 800 MG PO TABS
1600.0000 mg | ORAL_TABLET | Freq: Three times a day (TID) | ORAL | Status: AC
  Administered 2020-07-12 (×3): 1600 mg
  Filled 2020-07-11 (×3): qty 2

## 2020-07-11 MED ORDER — ROCURONIUM BROMIDE 50 MG/5ML IV SOLN
100.0000 mg | Freq: Once | INTRAVENOUS | Status: AC
  Filled 2020-07-11: qty 10

## 2020-07-11 MED ORDER — SODIUM CHLORIDE 0.9 % IV SOLN
250.0000 mL | INTRAVENOUS | Status: DC
  Administered 2020-07-11 – 2020-07-15 (×2): 250 mL via INTRAVENOUS

## 2020-07-11 MED ORDER — NOREPINEPHRINE 4 MG/250ML-% IV SOLN
5.0000 ug/min | INTRAVENOUS | Status: DC
  Administered 2020-07-11: 5 ug/min via INTRAVENOUS
  Administered 2020-07-11: 16 ug/min via INTRAVENOUS
  Administered 2020-07-11: 10 ug/min via INTRAVENOUS
  Administered 2020-07-12: 15 ug/min via INTRAVENOUS
  Filled 2020-07-11 (×3): qty 250

## 2020-07-11 MED ORDER — SODIUM CHLORIDE 0.9 % IV SOLN: 250.0000 mL | INTRAVENOUS | Status: DC

## 2020-07-11 MED ORDER — NOREPINEPHRINE 4 MG/250ML-% IV SOLN
INTRAVENOUS | Status: AC
  Filled 2020-07-11: qty 250

## 2020-07-11 MED ORDER — DOCUSATE SODIUM 50 MG/5ML PO LIQD: 100.0000 mg | Freq: Two times a day (BID) | ORAL | Status: DC

## 2020-07-11 MED ORDER — ACETAMINOPHEN 160 MG/5ML PO SOLN: 650.0000 mg | Freq: Four times a day (QID) | ORAL | Status: DC | PRN

## 2020-07-11 MED ORDER — INSULIN DETEMIR 100 UNIT/ML ~~LOC~~ SOLN
12.0000 [IU] | Freq: Two times a day (BID) | SUBCUTANEOUS | Status: DC
  Administered 2020-07-11 – 2020-07-12 (×3): 12 [IU] via SUBCUTANEOUS
  Filled 2020-07-11 (×4): qty 0.12

## 2020-07-11 MED ORDER — NOREPINEPHRINE 4 MG/250ML-% IV SOLN: 0.0000 ug/min | INTRAVENOUS | Status: DC

## 2020-07-11 MED ORDER — QUETIAPINE FUMARATE 50 MG PO TABS
50.0000 mg | ORAL_TABLET | Freq: Two times a day (BID) | ORAL | Status: DC
  Administered 2020-07-12 – 2020-07-16 (×9): 50 mg
  Filled 2020-07-11 (×9): qty 1

## 2020-07-11 MED ORDER — MIDAZOLAM HCL 2 MG/2ML IJ SOLN: 1.0000 mg | Freq: Once | INTRAMUSCULAR | Status: AC

## 2020-07-11 MED ORDER — INSULIN DETEMIR 100 UNIT/ML ~~LOC~~ SOLN
20.0000 [IU] | Freq: Two times a day (BID) | SUBCUTANEOUS | Status: DC
  Filled 2020-07-11 (×2): qty 0.2

## 2020-07-11 MED ORDER — DEXAMETHASONE SODIUM PHOSPHATE 10 MG/ML IJ SOLN
6.0000 mg | INTRAMUSCULAR | Status: AC
  Administered 2020-07-11 – 2020-07-12 (×2): 6 mg via INTRAVENOUS
  Filled 2020-07-11 (×2): qty 1

## 2020-07-11 MED ORDER — FREE WATER: 200.0000 mL | Status: DC

## 2020-07-11 MED ORDER — PANTOPRAZOLE SODIUM 40 MG PO PACK
40.0000 mg | PACK | Freq: Every day | ORAL | Status: DC
  Administered 2020-07-12 – 2020-07-16 (×5): 40 mg
  Filled 2020-07-11 (×5): qty 20

## 2020-07-11 MED ORDER — SODIUM ZIRCONIUM CYCLOSILICATE 5 G PO PACK
5.0000 g | PACK | Freq: Once | ORAL | Status: DC
  Filled 2020-07-11: qty 1

## 2020-07-11 MED ORDER — MIDAZOLAM HCL 2 MG/2ML IJ SOLN
INTRAMUSCULAR | Status: AC
  Administered 2020-07-11: 1 mg via INTRAVENOUS
  Filled 2020-07-11: qty 4

## 2020-07-11 MED ORDER — POLYETHYLENE GLYCOL 3350 17 G PO PACK: 17.0000 g | PACK | Freq: Every day | ORAL | Status: DC

## 2020-07-11 MED ORDER — CLONIDINE HCL 0.1 MG PO TABS
0.1000 mg | ORAL_TABLET | Freq: Three times a day (TID) | ORAL | Status: DC | PRN
  Administered 2020-07-13: 0.1 mg
  Filled 2020-07-11: qty 1

## 2020-07-11 MED ORDER — ETOMIDATE 2 MG/ML IV SOLN: 20.0000 mg | Freq: Once | INTRAVENOUS | Status: AC

## 2020-07-11 MED ORDER — FENTANYL CITRATE (PF) 100 MCG/2ML IJ SOLN
INTRAMUSCULAR | Status: AC
  Administered 2020-07-11: 100 ug via INTRAVENOUS
  Filled 2020-07-11: qty 2

## 2020-07-11 MED ORDER — POLYETHYLENE GLYCOL 3350 17 G PO PACK: 17.0000 g | PACK | Freq: Every day | ORAL | Status: DC | PRN

## 2020-07-11 MED ORDER — INSULIN ASPART 100 UNIT/ML ~~LOC~~ SOLN
0.0000 [IU] | SUBCUTANEOUS | Status: DC
  Administered 2020-07-11: 4 [IU] via SUBCUTANEOUS
  Administered 2020-07-11: 11 [IU] via SUBCUTANEOUS
  Administered 2020-07-11: 7 [IU] via SUBCUTANEOUS
  Administered 2020-07-11: 4 [IU] via SUBCUTANEOUS
  Administered 2020-07-12 (×2): 7 [IU] via SUBCUTANEOUS
  Administered 2020-07-12: 11 [IU] via SUBCUTANEOUS
  Administered 2020-07-12: 5 [IU] via SUBCUTANEOUS
  Administered 2020-07-12: 4 [IU] via SUBCUTANEOUS
  Administered 2020-07-13: 7 [IU] via SUBCUTANEOUS
  Administered 2020-07-13: 11 [IU] via SUBCUTANEOUS
  Administered 2020-07-13: 15 [IU] via SUBCUTANEOUS
  Administered 2020-07-13: 20 [IU] via SUBCUTANEOUS

## 2020-07-11 MED ORDER — AMIODARONE HCL 200 MG PO TABS: 200.0000 mg | ORAL_TABLET | Freq: Two times a day (BID) | ORAL | Status: DC

## 2020-07-11 MED ORDER — FENTANYL 2500MCG IN NS 250ML (10MCG/ML) PREMIX INFUSION
0.0000 ug/h | INTRAVENOUS | Status: DC
  Administered 2020-07-11: 50 ug/h via INTRAVENOUS
  Administered 2020-07-11: 80 ug/h via INTRAVENOUS
  Administered 2020-07-12: 25 ug/h via INTRAVENOUS
  Administered 2020-07-13: 50 ug/h via INTRAVENOUS
  Administered 2020-07-14: 200 ug/h via INTRAVENOUS
  Administered 2020-07-15 (×2): 150 ug/h via INTRAVENOUS
  Administered 2020-07-16: 200 ug/h via INTRAVENOUS
  Filled 2020-07-11 (×7): qty 250

## 2020-07-11 MED ORDER — DOCUSATE SODIUM 50 MG/5ML PO LIQD
100.0000 mg | Freq: Two times a day (BID) | ORAL | Status: DC
  Administered 2020-07-12 – 2020-07-15 (×8): 100 mg
  Filled 2020-07-11 (×8): qty 10

## 2020-07-11 MED ORDER — NOREPINEPHRINE 4 MG/250ML-% IV SOLN
2.0000 ug/min | INTRAVENOUS | Status: DC
  Filled 2020-07-11: qty 250

## 2020-07-11 MED ORDER — ETOMIDATE 2 MG/ML IV SOLN
INTRAVENOUS | Status: AC
  Administered 2020-07-11: 20 mg via INTRAVENOUS
  Filled 2020-07-11: qty 20

## 2020-07-11 NOTE — Progress Notes (Signed)
CCM attempted using easy glide to assist with gastric tube placement. Insertion was unsuccessful after several attempts at this time and per hand off report this am, several attempts had been made overnight. Will proceed to place coretrak when that team is available on 07/12/20.

## 2020-07-11 NOTE — Progress Notes (Signed)
Video call with daughter, [name]. Patient intubated and sedated, unresponsive

## 2020-07-11 NOTE — Procedures (Signed)
Central Venous Catheter Insertion Procedure Note  Hagop Mccollam  [id number]  05/03/1951  Date:07/11/20  Time:2:26 AM   Provider Performing:Emaleigh Guimond Mechele Collin   Procedure: Insertion of Non-tunneled Central Venous Catheter(36556) with US guidance (62376)   Indication(s) Medication administration and Difficult access  Consent Unable to obtain consent due to emergent nature of procedure.  Anesthesia Topical only with 1% lidocaine   Timeout Verified patient identification, verified procedure, site/side was marked, verified correct patient position, special equipment/implants available, medications/allergies/relevant history reviewed, required imaging and test results available.  Sterile Technique Maximal sterile technique including full sterile barrier drape, hand hygiene, sterile gown, sterile gloves, mask, hair covering, sterile ultrasound probe cover (if used).  Procedure Description Area of catheter insertion was cleaned with chlorhexidine and draped in sterile fashion.  With real-time ultrasound guidance a central venous catheter was placed into the right internal jugular vein. Nonpulsatile blood flow and easy flushing noted in all ports.  The catheter was sutured in place and sterile dressing applied.  Complications/Tolerance None; patient tolerated the procedure well. Chest X-ray is ordered to verify placement for internal jugular or subclavian cannulation.   Chest x-ray is not ordered for femoral cannulation.  EBL Minimal  Specimen(s) None  Mechele Collin, M.D. [facility] Pulmonary/Critical Care Medicine 07/11/2020 2:26 AM

## 2020-07-11 NOTE — Progress Notes (Signed)
PT Cancellation Note  Patient Details Name: Gregory [name] MRN: [id number] DOB: Mar 04, 1951   Cancelled Treatment:    Reason Eval/Treat Not Completed: Medical issues which prohibited therapy.  Pt newly intubated and sedated.  Will sign off at this time and ask for a reorder with appropriate for therapies. 07/11/2020  Jacinto Halim., PT Acute Rehabilitation Services [phone]  (pager) [phone]  (office)   Eliseo Gum Courtney Fenlon 07/11/2020, 3:52 PM

## 2020-07-11 NOTE — Progress Notes (Signed)
ANTICOAGULATION CONSULT NOTE - Follow Up Consult  Pharmacy Consult for Heparin Indication: BL DVT  No Known Allergies  Patient Measurements: Height: 5\' 7"  (170.2 cm) Weight: 101.3 kg (223 lb 5.2 oz) IBW/kg (Calculated) : 66.1 Heparin Dosing Weight: 88kg  Vital Signs: Temp: 98.9 F (37.2 C) (08/05 1923) Temp Source: Oral (08/05 1923) BP: 83/64 (08/05 2000) Pulse Rate: 67 (08/05 2000)  Labs: Recent Labs    07/10/20 0313 07/10/20 0313 07/10/20 2156 07/10/20 2219 07/11/20 0317 07/11/20 0317 07/11/20 0330 07/11/20 1202 07/11/20 1446 07/11/20 2103  HGB 12.4*   < > 12.7*   < > 12.2*   < > 12.4* 11.9*  --   --   HCT 37.7*   < > 40.1   < > 36.0*  --  39.6 35.0*  --   --   PLT 101*  --  94*  --   --   --  113*  --   --   --   APTT  --   --  >200*  --   --   --   --   --  98* 125*  HEPARINUNFRC  --   --  >2.20*  --   --   --   --   --   --   --   CREATININE 3.03*  --  2.96*  --   --   --  2.64*  --   --   --    < > = values in this interval not displayed.    Estimated Creatinine Clearance: 30.8 mL/min (A) (by C-G formula based on SCr of 2.64 mg/dL (H)).    Assessment: Anticoag: Eliquis for acute bilateral DVT (received 3 doses, LD 8/3 AM), d-dimer >20 >> Heparin since NPO/AMS -  APTT 125 (GOAL 66-102) increased from previous level. Drawn from R ART line, heparin infusion in R IJ. Pt only on 9 units/kg/hr.   Goal of Therapy:  aPTT 66-102 seconds Monitor platelets by anticoagulation protocol: Yes   Plan:  Decrease heparin to 800 units/hr and recheck in AM.     Cherese Lozano S. [id number], PharmD, BCPS Clinical Staff Pharmacist [email or web address] Merilynn Finland, Merilynn Finland 07/11/2020,9:58 PM

## 2020-07-11 NOTE — Progress Notes (Signed)
ANTICOAGULATION CONSULT NOTE   Pharmacy Consult:  Eliquis >> Heparin Indication: DVT  No Known Allergies  Patient Measurements: Height: 5\' 7"  (170.2 cm) Weight: 101.3 kg (223 lb 5.2 oz) IBW/kg (Calculated) : 66.1 Heparin Dosing Weight: 88 kg  Vital Signs: Temp: 99 F (37.2 C) (08/05 1142) Temp Source: Axillary (08/05 1142) BP: 89/48 (08/05 1500) Pulse Rate: 71 (08/05 1500)  Labs: Recent Labs    07/10/20 0313 07/10/20 0313 07/10/20 2156 07/10/20 2219 07/11/20 0317 07/11/20 0317 07/11/20 0330 07/11/20 1202 07/11/20 1446  HGB 12.4*   < > 12.7*   < > 12.2*   < > 12.4* 11.9*  --   HCT 37.7*   < > 40.1   < > 36.0*  --  39.6 35.0*  --   PLT 101*  --  94*  --   --   --  113*  --   --   APTT  --   --  >200*  --   --   --   --   --  98*  HEPARINUNFRC  --   --  >2.20*  --   --   --   --   --   --   CREATININE 3.03*  --  2.96*  --   --   --  2.64*  --   --    < > = values in this interval not displayed.    Estimated Creatinine Clearance: 30.8 mL/min (A) (by C-G formula based on SCr of 2.64 mg/dL (H)).   Medical History: History reviewed. No pertinent past medical history.   Assessment: 16 YOM presented with hypoxia and confusion in setting of Covid-19.  Patient transferred to the ICU due to respiratory distress.  He has been on Eliquis for acute bilateral DVT, last dose on 07/09/20 AM.  Pharmacy consulted to transition patient to IV heparin due to NPO status/AMS.  Patient has worsening AKI. CBC stable - platelet count low at 101K.  D-dimer elevated.    APTT and HL are not yet correlating - using aPTT for now.  After holding and dose reduction, aPTT is down to 98 - therapeutic for goal on 900 units/hr. CBC is stable. No bleeding noted. Platelets are low but stable.   Goal of Therapy:  Heparin level 0.3-0.7 units/ml aPTT 66 - 102 seconds Monitor platelets by anticoagulation protocol: Yes   Plan:  Continue Heparin gtt at 900 units/hr Since at upper end of range, will  recheck aPTT in 6 hours to ensure not accumulating.  Daily HL and APTT while on therapy   09/08/20, PharmD, BCPS, BCCCP Clinical Pharmacist Please refer to [facility] for [facility] Pharmacy numbers 07/11/2020, 3:15 PM

## 2020-07-11 NOTE — Consult Note (Signed)
Consultation Note Date: 07/11/2020   Patient Name: Gregory [name]  DOB: 05/22/1951  MRN: [id number]  Age / Sex: 69 y.o., male  PCP: [facility] Referring Physician: Cheri Fowler, MD  Reason for Consultation: Establishing goals of care  HPI/Patient Profile: 69 y.o. male  with past medical history of DM, HTN, gout, and CVA w/o focal deficits admitted on 06/16/2020 with COVID pneumonia.  Patient required intubation early AM 8/5 d/t respiratory failure. PMT consulted to discuss GOC.  Clinical Assessment and Goals of Care: I have reviewed medical records including EPIC notes, labs and imaging, received report from MD, assessed the patient and then spoke with his daughter Gregory [name]  to discuss diagnosis prognosis, GOC, EOL wishes, disposition and options.  I introduced Palliative Medicine as specialized medical care for people living with serious illness. It focuses on providing relief from the symptoms and stress of a serious illness. The goal is to improve quality of life for both the patient and the family.  Gregory [name] tells me patient has a wife who had a stroke and he serves as her primary caregiver. Gregory [name] tells me wife is unable to serve as Management consultant d/t her stroke - she is nonverbal. Patient has other children but Gregory [name] has been serving as Oncologist.   As far as functional and nutritional status, she tells me he was doing well. Some mobility limitation r/t gout but still able to serve as full time caregiver. He was also having problems with uncontrolled diabetes.    We discussed patient's current illness and what it means in the larger context of patient's on-going co-morbidities.  Natural disease trajectory and expectations at EOL were discussed. Discussed respiratory failure and need for ventilator support. Discussed that [name] is very ill and prognosis is guarded. She expresses understanding.  I attempted to  elicit values and goals of care important to the patient.  Gregory [name] is unsure what would be most important to [name]. She shares they never discussed advance directives. She does express desire for full code/full scope. If Mr.Stanfield requires trach she does state she would consider that - we discussed ongoing discussion would be needed and to see how [name] does in coming days.   She brings up long-term care and states that if long-term care is needed she hopes patient can be relocated closer to her in [place].  Discussed with Gregory [name] the importance of continued conversation with family and the medical providers regarding overall plan of care and treatment options, ensuring decisions are within the context of the patient's values and GOCs.    Janessa tearful throughout conversation. Emotional support provided.  Questions and concerns were addressed. The family was encouraged to call with questions or concerns.   Primary Decision Maker NEXT OF KIN - daughter Gregory [name]  SUMMARY OF RECOMMENDATIONS   - full code/full scope - PMT will follow for support and decision making  Prognosis:   Unable to determine  Discharge Planning: To Be Determined      Primary Diagnoses: Present on Admission: . Sepsis (HCC)   I have reviewed the medical record, interviewed the patient and family, and examined the patient. The following aspects are pertinent.  History reviewed. No pertinent past medical history. Social History   Socioeconomic History  . Marital status: Married    Spouse name: Not on file  . Number of children: Not on file  . Years of education: Not on file  . Highest education level: Not on file  Occupational History  .  Not on file  Tobacco Use  . Smoking status: Not on file  Substance and Sexual Activity  . Alcohol use: Not on file  . Drug use: Not on file  . Sexual activity: Not on file  Other Topics Concern  . Not on file  Social History Narrative  . Not on file    Social Determinants of Health   Financial Resource Strain:   . Difficulty of Paying Living Expenses:   Food Insecurity:   . Worried About Programme researcher, broadcasting/film/video in the Last Year:   . Barista in the Last Year:   Transportation Needs:   . Freight forwarder (Medical):   Marland Kitchen Lack of Transportation (Non-Medical):   Physical Activity:   . Days of Exercise per Week:   . Minutes of Exercise per Session:   Stress:   . Feeling of Stress :   Social Connections:   . Frequency of Communication with Friends and Family:   . Frequency of Social Gatherings with Friends and Family:   . Attends Religious Services:   . Active Member of Clubs or Organizations:   . Attends Banker Meetings:   Marland Kitchen Marital Status:    History reviewed. No pertinent family history. Scheduled Meds: . chlorhexidine  15 mL Mouth Rinse BID  . Chlorhexidine Gluconate Cloth  6 each Topical Q0600  . dexamethasone (DECADRON) injection  6 mg Intravenous Q24H  . docusate  100 mg Per Tube BID  . free water  200 mL Per Tube Q4H  . insulin aspart  0-20 Units Subcutaneous Q4H  . insulin aspart  0-5 Units Subcutaneous QHS  . insulin detemir  12 Units Subcutaneous BID  . mouth rinse  15 mL Mouth Rinse q12n4p  . pantoprazole sodium  40 mg Per Tube Daily  . polyethylene glycol  17 g Per Tube Daily  . QUEtiapine  50 mg Per Tube BID  . sevelamer carbonate  1,600 mg Per Tube TID  . sodium chloride flush  10-40 mL Intracatheter Q12H  . sodium chloride flush  3 mL Intravenous Q12H  . sodium zirconium cyclosilicate  5 g Per Tube Once   Continuous Infusions: . sodium chloride Stopped (07/11/20 0256)  . sodium chloride 10 mL/hr at 07/11/20 1200  . sodium chloride    . amiodarone    . dexmedetomidine (PRECEDEX) IV infusion 0.4 mcg/kg/hr (07/11/20 0400)  . fentaNYL infusion INTRAVENOUS 150 mcg/hr (07/11/20 1200)  . heparin 900 Units/hr (07/11/20 1200)  . norepinephrine (LEVOPHED) Adult infusion 5 mcg/min (07/11/20  1200)  . propofol (DIPRIVAN) infusion 40 mcg/kg/min (07/11/20 1200)   PRN Meds:.acetaminophen (TYLENOL) oral liquid 160 mg/5 mL, cloNIDine, haloperidol lactate, pneumococcal 23 valent vaccine, polyethylene glycol, sodium chloride flush No Known Allergies Review of Systems  Unable to perform ROS: Intubated    Physical Exam Constitutional:      Comments: Sedated and intubated     Vital Signs: BP 92/70   Pulse 71   Temp 99 F (37.2 C) (Axillary)   Resp (!) 28   Ht 5\' 7"  (1.702 m)   Wt 101.3 kg   SpO2 100%   BMI 34.98 kg/m  Pain Scale: CPOT   Pain Score: 0-No pain   SpO2: SpO2: 100 % O2 Device:SpO2: 100 % O2 Flow Rate: .O2 Flow Rate (L/min): 45 L/min  IO: Intake/output summary:   Intake/Output Summary (Last 24 hours) at 07/11/2020 1325 Last data filed at 07/11/2020 1200 Gross per 24 hour  Intake 2187.16 ml  Output 1030 ml  Net 1157.16 ml    LBM: Last BM Date: 07/04/20 Baseline Weight: Weight: (!) 105.8 kg Most recent weight: Weight: 101.3 kg     Palliative Assessment/Data: PPS 30%    Time Total: 50 minutes Greater than 50%  of this time was spent counseling and coordinating care related to the above assessment and plan.  Gerlean Ren, DNP, AGNP-C Palliative Medicine Team [phone] Pager: [phone]

## 2020-07-11 NOTE — Progress Notes (Signed)
Spoke with family this am, updated on patient condition. Verbally informed CCM that patient did not have NG/OG/Coretrak access for meds/tube feeds etc.

## 2020-07-11 NOTE — Procedures (Signed)
Arterial Catheter Insertion Procedure Note  Gregory [name]  [id number]  1951-05-12  Date:07/11/20  Time:3:42 AM    Provider Performing: Kenyon Ana C    Procedure: Insertion of Arterial Line (24825) without US guidance  Indication(s) Blood pressure monitoring and/or need for frequent ABGs  Consent Unable to obtain consent due to emergent nature of procedure.  Anesthesia None   Time Out Verified patient identification, verified procedure, site/side was marked, verified correct patient position, special equipment/implants available, medications/allergies/relevant history reviewed, required imaging and test results available.   Sterile Technique Maximal sterile technique including full sterile barrier drape, hand hygiene, sterile gown, sterile gloves, mask, hair covering, sterile ultrasound probe cover (if used).   Procedure Description Area of catheter insertion was cleaned with chlorhexidine and draped in sterile fashion. Without real-time ultrasound guidance an arterial catheter was placed into the right radial artery.  Appropriate arterial tracings confirmed on monitor.     Complications/Tolerance None; patient tolerated the procedure well.   EBL Minimal   Specimen(s) None

## 2020-07-11 NOTE — Progress Notes (Signed)
NAME:  Karlon Schlafer, MRN:  [id number], DOB:  Sep 23, 1951, LOS: 8 ADMISSION DATE:  06/28/2020, CONSULTATION DATE:  07/09/2020 REFERRING MD:  Elgergawy, CHIEF COMPLAINT:  SOB, hypoxemia   Brief History   69 year old male with history of diabetes mellitus, hypertension, gout and CVA without focal deficits presented on 7/28 with hypoxia on non-rebreather. Code sepsis initiated s/p fluid resuscitation. Admitted to ICU for acute hypoxic respiratory failure 2/2 COVID pneumonia.   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 Asthma   Significant Hospital Events   7/29 admit to ICU 7/31 transfer to floor 8/3 transferred to ICU for worsening hypoxia  8/5 intubated  Consults:  PCCM  Procedures:  None  Significant Diagnostic Tests:  CXR 7/28> Diffuse bilateral airspace disease, pneumonia v edema CXR 8/4 > Diffuse bilateral airspace disease with air bronchograms, progression since prior study   Micro Data:  COVID positive 7/28 Blood cx 7/28 > negative  Urine cx 7/28> negative  MRSA 7/29 > negative  Antimicrobials:  Vancomycin 7/28 Cefepime 7/28 Flagyl 7/28 Remdesivir 7/28> 8/2 Tovilizumab 7/28  Interim history/subjective:  Overnight, patient intubated for respiratory failure. This morning, remains on full ventilator support and heavily sedated and pressor support.   Objective   Blood pressure 110/63, pulse 72, temperature 97.8 F (36.6 C), temperature source Axillary, resp. rate 14, height 5\' 7"  (1.702 m), weight 101.3 kg, SpO2 99 %.    Vent Mode: PRVC FiO2 (%):  [90 %-100 %] 90 % Set Rate:  [18 bmp-22 bmp] 22 bmp Vt Set:  [570 mL] 570 mL PEEP:  [14 cmH20-16 cmH20] 14 cmH20 Plateau Pressure:  [30 cmH20-33 cmH20] 30 cmH20   Intake/Output Summary (Last 24 hours) at 07/11/2020 0700 Last data filed at 07/11/2020 0400 Gross per 24 hour  Intake 1596.4 ml  Output 900 ml  Net 696.4 ml   Filed Weights   07/08/20 0551 07/09/20  0352 07/10/20 0359  Weight: (!) 102.4 kg 101.6 kg 101.3 kg    Examination: General: critically ill appearing elderly gentleman; heavily sedated and mechanically ventilated  HENT: [place]/AT, anicteric sclerae, PERRL, ET tube in place  Lungs: diffuse rales bilaterally, on full ventilator support with FiO2 80% and PEEP 14 Cardiovascular: RRR, no m/r/g Abdomen: nondistended, soft, nontender, normoactive bowel sounds Extremities: warm/dry, nonedematous  Neuro: heavily sedated, PERRL  Resolved Hospital Problem list    Assessment & Plan:  Acute hypoxic respiratory failure secondary to moderate ARDS in setting of COVID 19 pneumonia Patient with worsening respiratory failure overnight secondary to agitation requiring intubation and mechanical ventilation. Repeat imaging following intubation with persistent diffuse bilateral opacities, unchanged from prior studies. Post intubation ABG with PF ratio 141, will hold off on proning at this time.  - Has completed Remdesivir on 8/1; Decadron 6mg  daily until 8/6 - Continue full ventilator support with goal O2 88-92%, Ppeak <30; wean as tolerated  - Repeat ABG this afternoon  Acute toxic metabolic encephalopathy Currently on fentanyl and propofol gtt  - Wean as tolerated for RASS goal -1 to -2   Hypotension: Patient hypotensive with SBP in 40s overnight during respiratory arrest for which started on levophed gtt - Wean as tolerated to maintain MAP >65  Acute on chronic renal insufficiency  Secondary hyperparathyroidism  Renal function slowly improving. However, does have hyperphosphatemia and hypocalcemia consistent with secondary hyperparathyroidism in setting of CKD. - Start Renvela 1600mg  tid until Phos <7.5, will decrease to 800mg  tid  - Continue to  monitor electrolytes and urine output  - Avoid nephrotoxic agents as able   Hypernatremia: Free water deficit 2.2L  - Free water replacement per tube q4h   Acute DVT - Heparin dosing per  pharmacy   Diabetes mellitus: Levemir 12U bid + SSI  Best practice:  Diet: NPO (tube feeds pending placement of Cortrak) Pain/Anxiety/Delirium protocol (if indicated): per protocol VAP protocol (if indicated): per protocol DVT prophylaxis: Heparin GI prophylaxis: Protonix Glucose control: Levemir + SSI Mobility: Bed rest  Code Status: FULL  Family Communication: will update daughter  Disposition: ICU  Labs    Critical care time: 40 minutes    Eliezer Bottom, MD Internal Medicine, PGY-2 07/11/20 7:00 AM Pager # [phone]

## 2020-07-11 NOTE — Progress Notes (Signed)
PCCM Interval Note  Called to bedside at 1 am for respiratory failure with O2 saturations in the 60s. Patient became emergent airway went saturations fell to the 40s and pressures dropped to SBP 40s. Patient intubated via DL with MAC 4 with grade III view. Central line placed. Arterial line in progress. Plan to prone pending ABG.  CXR 07/11/20 Unchanged diffuse bilateral airspace disease with air bronchograms. Interval placement of CVC and ETT in appropriate position. Imaging, labs and test noted above have been reviewed independently by me.  Primary team updated daughter on patient's critical status change.   CC: 60 min  Rodman Pickle, M.D. [facility] Pulmonary/Critical Care Medicine 07/11/2020 2:48 AM

## 2020-07-11 NOTE — Procedures (Signed)
Intubation Procedure Note  Gregory [name]  [id number]  [birth year]  Date:07/11/20  Time:2:17 AM   Provider Performing:Savanah Bayles Rodman Pickle    Procedure: Intubation (15400)  Indication(s) Respiratory Failure  Consent Unable to obtain consent due to emergent nature of procedure.   Anesthesia Etomidate, Versed and Fentanyl   Time Out Verified patient identification, verified procedure, site/side was marked, verified correct patient position, special equipment/implants available, medications/allergies/relevant history reviewed, required imaging and test results available.   Sterile Technique Usual hand hygeine, masks, and gloves were used   Procedure Description Patient positioned in bed supine. Patient became severely hypoxemic to the 40s. Sedation given as noted above. Glidescope at the time was noted to be non-functional. MAC 4 was used for direct laryngoscopy. Inserted ETT with grade III view. Number of attempts was 1.  Colorimetric CO2 detector was consistent with tracheal placement.  Complications/Tolerance Patient with severe persistent hypoxemia with slow recovery Chest X-ray is ordered to verify placement.   EBL Minimal   Specimen(s) None  Rodman Pickle, M.D. [facility] Pulmonary/Critical Care Medicine 07/11/2020 2:25 AM

## 2020-07-11 NOTE — Progress Notes (Signed)
IV TEAM: Responded for a difficult PIV consult. Upon reaching bedside, was advised and told by RN that team will be placing a central line access. Consult discontinued.

## 2020-07-11 NOTE — Progress Notes (Signed)
Initial Nutrition Assessment  DOCUMENTATION CODES:   Not applicable  INTERVENTION:   Tube Feeding once access obtained:  Vital AF 1.2 at 60 ml/hr ProSource TF 45 mL BID Provides 1808 kcals, 130 g of protein, 1166 mL of free water  TF regimen and propofol at current rate providing 2388 total kcal/day   Free water to begin once access obtained: current TF plus current free water flush provides 2366 mL of free water  NUTRITION DIAGNOSIS:   Inadequate oral intake related to acute illness as evidenced by NPO status.  GOAL:   Patient will meet greater than or equal to 90% of their needs  MONITOR:   Vent status, Labs, Weight trends, TF tolerance, Skin  REASON FOR ASSESSMENT:   Consult, Ventilator Enteral/tube feeding initiation and management  ASSESSMENT:   69 yo male admitted with COVID-19 pneumonia, AKI. PMH includes DM, HTN, gout, CVA   7/28 Admitted 8/05 Intubated  Intubated over night, multiple attempts to place NG/OG were unsuccessful. Plan to attempt Cortrak placement tomorrow  Patient is currently intubated on ventilator support, sedated on fentanyl and propofol, requiring levophed h MV: 13.3 L/min Temp (24hrs), Avg:97.9 F (36.6 C), Min:96.6 F (35.9 C), Max:99 F (37.2 C)  Propofol: 22 ml/hr  Unable to obtain diet and weight history from patient at this time. Noted recorded po intake   Recorded po intake had been 0% since 7/31. Inadequate po intake for at least 5 days   Noted free water ordered for hypernatremia but no access to administer  Phosphorus 7.3, noted renvela ordered today but again no access  Current weight 101.3 kg; admit weight 105.8 kg. Net negative 5 L  Labs: CBGs 256-295, sodium 146 (H), Creatinine 2.64, BUN 88, phosphorus 7.3 (H), potassium 5.2,  Meds: decadron, colace, ss novolog, levemir, miralax, renvela    Diet Order:   Diet Order    None      EDUCATION NEEDS:   Not appropriate for education at this time  Skin:  Skin  Assessment: Reviewed RN Assessment  Last BM:  8/3  Height:   Ht Readings from Last 1 Encounters:  07/04/20 5\' 7"  (1.702 m)    Weight:   Wt Readings from Last 1 Encounters:  07/10/20 101.3 kg    BMI:  Body mass index is 34.98 kg/m.  Estimated Nutritional Needs:   Kcal:  2025-2430 kcals  Protein:  120-140 g  Fluid:  >/= 2 L    02-01-1992 MS, RDN, LDN, CNSC Registered Dietitian III Clinical Nutrition RD Pager and On-Call Pager Number Located in [place]

## 2020-07-11 NOTE — Progress Notes (Signed)
Patient's daughter, Kaelan Amble, called and updated on patient status. Discussed guarded prognosis.  All questions and concerns addressed.   Eliezer Bottom, MD Internal Medicine, PGY-2 07/11/20 2:39 PM Pager # [phone]

## 2020-07-12 ENCOUNTER — Inpatient Hospital Stay (HOSPITAL_COMMUNITY): Payer: No Typology Code available for payment source

## 2020-07-12 DIAGNOSIS — N179 Acute kidney failure, unspecified: Secondary | ICD-10-CM

## 2020-07-12 LAB — POCT I-STAT 7, (LYTES, BLD GAS, ICA,H+H)
Acid-base deficit: 6 mmol/L — ABNORMAL HIGH (ref 0.0–2.0)
Bicarbonate: 20.3 mmol/L (ref 20.0–28.0)
Calcium, Ion: 1.01 mmol/L — ABNORMAL LOW (ref 1.15–1.40)
HCT: 27 % — ABNORMAL LOW (ref 39.0–52.0)
Hemoglobin: 9.2 g/dL — ABNORMAL LOW (ref 13.0–17.0)
O2 Saturation: 87 %
Patient temperature: 99.7
Potassium: 5.2 mmol/L — ABNORMAL HIGH (ref 3.5–5.1)
Sodium: 147 mmol/L — ABNORMAL HIGH (ref 135–145)
TCO2: 22 mmol/L (ref 22–32)
pCO2 arterial: 41.9 mmHg (ref 32.0–48.0)
pH, Arterial: 7.297 — ABNORMAL LOW (ref 7.350–7.450)
pO2, Arterial: 60 mmHg — ABNORMAL LOW (ref 83.0–108.0)

## 2020-07-12 LAB — CBC
HCT: 36.8 % — ABNORMAL LOW (ref 39.0–52.0)
HCT: 32.9 % — ABNORMAL LOW (ref 39.0–52.0)
Hemoglobin: 11.8 g/dL — ABNORMAL LOW (ref 13.0–17.0)
Hemoglobin: 10.2 g/dL — ABNORMAL LOW (ref 13.0–17.0)
MCH: 29.4 pg (ref 26.0–34.0)
MCH: 28.4 pg (ref 26.0–34.0)
MCHC: 32.1 g/dL (ref 30.0–36.0)
MCHC: 31 g/dL (ref 30.0–36.0)
MCV: 91.8 fL (ref 80.0–100.0)
MCV: 91.6 fL (ref 80.0–100.0)
Platelets: 77 10*3/uL — ABNORMAL LOW (ref 150–400)
Platelets: 65 10*3/uL — ABNORMAL LOW (ref 150–400)
RBC: 4.01 MIL/uL — ABNORMAL LOW (ref 4.22–5.81)
RBC: 3.59 MIL/uL — ABNORMAL LOW (ref 4.22–5.81)
RDW: 13.6 % (ref 11.5–15.5)
RDW: 13.9 % (ref 11.5–15.5)
WBC: 7.3 10*3/uL (ref 4.0–10.5)
WBC: 6.5 10*3/uL (ref 4.0–10.5)
nRBC: 1 % — ABNORMAL HIGH (ref 0.0–0.2)
nRBC: 0.8 % — ABNORMAL HIGH (ref 0.0–0.2)

## 2020-07-12 LAB — COMPREHENSIVE METABOLIC PANEL
ALT: 24 U/L (ref 0–44)
AST: 43 U/L — ABNORMAL HIGH (ref 15–41)
Albumin: 1.9 g/dL — ABNORMAL LOW (ref 3.5–5.0)
Alkaline Phosphatase: 105 U/L (ref 38–126)
Anion gap: 15 (ref 5–15)
BUN: 109 mg/dL — ABNORMAL HIGH (ref 8–23)
CO2: 17 mmol/L — ABNORMAL LOW (ref 22–32)
Calcium: 7.5 mg/dL — ABNORMAL LOW (ref 8.9–10.3)
Chloride: 112 mmol/L — ABNORMAL HIGH (ref 98–111)
Creatinine, Ser: 4.56 mg/dL — ABNORMAL HIGH (ref 0.61–1.24)
GFR calc Af Amer: 14 mL/min — ABNORMAL LOW (ref >60)
GFR calc non Af Amer: 12 mL/min — ABNORMAL LOW (ref >60)
Glucose, Bld: 287 mg/dL — ABNORMAL HIGH (ref 70–99)
Potassium: 6.2 mmol/L — ABNORMAL HIGH (ref 3.5–5.1)
Sodium: 144 mmol/L (ref 135–145)
Total Bilirubin: 0.7 mg/dL (ref 0.3–1.2)
Total Protein: 4.9 g/dL — ABNORMAL LOW (ref 6.5–8.1)

## 2020-07-12 LAB — GLUCOSE, CAPILLARY
Glucose-Capillary: 236 mg/dL — ABNORMAL HIGH (ref 70–99)
Glucose-Capillary: 230 mg/dL — ABNORMAL HIGH (ref 70–99)
Glucose-Capillary: 276 mg/dL — ABNORMAL HIGH (ref 70–99)
Glucose-Capillary: 226 mg/dL — ABNORMAL HIGH (ref 70–99)
Glucose-Capillary: 168 mg/dL — ABNORMAL HIGH (ref 70–99)
Glucose-Capillary: 194 mg/dL — ABNORMAL HIGH (ref 70–99)
Glucose-Capillary: 239 mg/dL — ABNORMAL HIGH (ref 70–99)

## 2020-07-12 LAB — BASIC METABOLIC PANEL
Anion gap: 12 (ref 5–15)
Anion gap: 10 (ref 5–15)
BUN: 114 mg/dL — ABNORMAL HIGH (ref 8–23)
BUN: 121 mg/dL — ABNORMAL HIGH (ref 8–23)
CO2: 20 mmol/L — ABNORMAL LOW (ref 22–32)
CO2: 21 mmol/L — ABNORMAL LOW (ref 22–32)
Calcium: 8.1 mg/dL — ABNORMAL LOW (ref 8.9–10.3)
Calcium: 7.6 mg/dL — ABNORMAL LOW (ref 8.9–10.3)
Chloride: 112 mmol/L — ABNORMAL HIGH (ref 98–111)
Chloride: 113 mmol/L — ABNORMAL HIGH (ref 98–111)
Creatinine, Ser: 4.72 mg/dL — ABNORMAL HIGH (ref 0.61–1.24)
Creatinine, Ser: 5.15 mg/dL — ABNORMAL HIGH (ref 0.61–1.24)
GFR calc Af Amer: 14 mL/min — ABNORMAL LOW (ref >60)
GFR calc Af Amer: 12 mL/min — ABNORMAL LOW (ref >60)
GFR calc non Af Amer: 12 mL/min — ABNORMAL LOW (ref >60)
GFR calc non Af Amer: 11 mL/min — ABNORMAL LOW (ref >60)
Glucose, Bld: 241 mg/dL — ABNORMAL HIGH (ref 70–99)
Glucose, Bld: 196 mg/dL — ABNORMAL HIGH (ref 70–99)
Potassium: 5.5 mmol/L — ABNORMAL HIGH (ref 3.5–5.1)
Potassium: 5.7 mmol/L — ABNORMAL HIGH (ref 3.5–5.1)
Sodium: 144 mmol/L (ref 135–145)
Sodium: 144 mmol/L (ref 135–145)

## 2020-07-12 LAB — MAGNESIUM
Magnesium: 3 mg/dL — ABNORMAL HIGH (ref 1.7–2.4)
Magnesium: 2.9 mg/dL — ABNORMAL HIGH (ref 1.7–2.4)
Magnesium: 3 mg/dL — ABNORMAL HIGH (ref 1.7–2.4)

## 2020-07-12 LAB — APTT
aPTT: 121 seconds — ABNORMAL HIGH (ref 24–36)
aPTT: 105 seconds — ABNORMAL HIGH (ref 24–36)

## 2020-07-12 LAB — PHOSPHORUS
Phosphorus: 7.7 mg/dL — ABNORMAL HIGH (ref 2.5–4.6)
Phosphorus: 7.3 mg/dL — ABNORMAL HIGH (ref 2.5–4.6)
Phosphorus: 8.3 mg/dL — ABNORMAL HIGH (ref 2.5–4.6)

## 2020-07-12 LAB — TRIGLYCERIDES: Triglycerides: 167 mg/dL — ABNORMAL HIGH (ref <150)

## 2020-07-12 LAB — LACTIC ACID, PLASMA: Lactic Acid, Venous: 2.5 mmol/L (ref 0.5–1.9)

## 2020-07-12 LAB — HEPARIN LEVEL (UNFRACTIONATED): Heparin Unfractionated: 1.4 IU/mL — ABNORMAL HIGH (ref 0.30–0.70)

## 2020-07-12 MED ORDER — NEPRO/CARBSTEADY PO LIQD
1000.0000 mL | ORAL | Status: DC
  Administered 2020-07-12 – 2020-07-16 (×3): 1000 mL
  Filled 2020-07-12 (×6): qty 1000

## 2020-07-12 MED ORDER — FENTANYL CITRATE (PF) 100 MCG/2ML IJ SOLN: 25.0000 ug | INTRAMUSCULAR | Status: DC | PRN

## 2020-07-12 MED ORDER — HEPARIN SOD (PORK) LOCK FLUSH 100 UNIT/ML IV SOLN
500.0000 [IU] | Freq: Once | INTRAVENOUS | Status: AC
  Filled 2020-07-12: qty 5

## 2020-07-12 MED ORDER — ADULT MULTIVITAMIN W/MINERALS CH
1.0000 | ORAL_TABLET | Freq: Every day | ORAL | Status: DC
  Administered 2020-07-12 – 2020-07-16 (×5): 1
  Filled 2020-07-12 (×5): qty 1

## 2020-07-12 MED ORDER — SODIUM ZIRCONIUM CYCLOSILICATE 10 G PO PACK
10.0000 g | PACK | Freq: Once | ORAL | Status: AC
  Administered 2020-07-12: 10 g
  Filled 2020-07-12: qty 1

## 2020-07-12 MED ORDER — PROSOURCE TF PO LIQD
45.0000 mL | Freq: Three times a day (TID) | ORAL | Status: DC
  Administered 2020-07-12 – 2020-07-16 (×12): 45 mL
  Filled 2020-07-12 (×11): qty 45

## 2020-07-12 MED ORDER — SODIUM ZIRCONIUM CYCLOSILICATE 10 G PO PACK
10.0000 g | PACK | Freq: Three times a day (TID) | ORAL | Status: DC
  Administered 2020-07-12: 10 g via ORAL
  Filled 2020-07-12: qty 1

## 2020-07-12 MED ORDER — ORAL CARE MOUTH RINSE
15.0000 mL | OROMUCOSAL | Status: DC
  Administered 2020-07-12 – 2020-07-16 (×45): 15 mL via OROMUCOSAL

## 2020-07-12 MED ORDER — FENTANYL CITRATE (PF) 100 MCG/2ML IJ SOLN
25.0000 ug | INTRAMUSCULAR | Status: DC | PRN
  Administered 2020-07-13: 25 ug via INTRAVENOUS
  Administered 2020-07-16: 50 ug via INTRAVENOUS

## 2020-07-12 MED ORDER — INSULIN DETEMIR 100 UNIT/ML ~~LOC~~ SOLN
20.0000 [IU] | Freq: Two times a day (BID) | SUBCUTANEOUS | Status: DC
  Administered 2020-07-12: 20 [IU] via SUBCUTANEOUS
  Filled 2020-07-12 (×3): qty 0.2

## 2020-07-12 MED ORDER — SODIUM CHLORIDE 0.9 % IV SOLN
1.0000 g | Freq: Once | INTRAVENOUS | Status: AC
  Administered 2020-07-12: 1 g via INTRAVENOUS
  Filled 2020-07-12: qty 10

## 2020-07-12 MED ORDER — FUROSEMIDE 10 MG/ML IJ SOLN
100.0000 mg | Freq: Once | INTRAVENOUS | Status: AC
  Administered 2020-07-12: 100 mg via INTRAVENOUS
  Filled 2020-07-12: qty 10

## 2020-07-12 MED ORDER — FREE WATER
250.0000 mL | Status: DC
  Administered 2020-07-12 – 2020-07-14 (×12): 250 mL

## 2020-07-12 MED ORDER — HEPARIN SOD (PORK) LOCK FLUSH 100 UNIT/ML IV SOLN
500.0000 [IU] | Freq: Once | INTRAVENOUS | Status: AC
  Administered 2020-07-12: 500 [IU] via INTRAVENOUS
  Filled 2020-07-12: qty 5

## 2020-07-12 MED ORDER — CHLORHEXIDINE GLUCONATE 0.12% ORAL RINSE (MEDLINE KIT)
15.0000 mL | Freq: Two times a day (BID) | OROMUCOSAL | Status: DC
  Administered 2020-07-12 – 2020-07-16 (×9): 15 mL via OROMUCOSAL

## 2020-07-12 MED ORDER — SODIUM BICARBONATE 8.4 % IV SOLN
100.0000 meq | Freq: Once | INTRAVENOUS | Status: AC
  Administered 2020-07-12: 100 meq via INTRAVENOUS
  Filled 2020-07-12: qty 100

## 2020-07-12 NOTE — Progress Notes (Signed)
NAME:  Gregory [name], MRN:  [id number], DOB:  1951-07-03, LOS: 9 ADMISSION DATE:  07/15/2020, CONSULTATION DATE:  07/09/2020 REFERRING MD:  Elgergawy, CHIEF COMPLAINT:  SOB, hypoxemia   Brief History   69 year old male with history of diabetes mellitus, hypertension, gout and CVA without focal deficits presented on 7/28 with hypoxia on non-rebreather. Code sepsis initiated s/p fluid resuscitation. Admitted to ICU for acute hypoxic respiratory failure 2/2 COVID pneumonia.   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 Asthma   Significant Hospital Events   7/29 admit to ICU 7/31 transfer to floor 8/3 transferred to ICU for worsening hypoxia  8/5 intubated  Consults:  PCCM  Procedures:  None  Significant Diagnostic Tests:  CXR 7/28> Diffuse bilateral airspace disease, pneumonia v edema CXR 8/4 > Diffuse bilateral airspace disease with air bronchograms, progression since prior study   Micro Data:  COVID positive 7/28 Blood cx 7/28 > negative  Urine cx 7/28> negative  MRSA 7/29 > negative  Antimicrobials:  Vancomycin 7/28 Cefepime 7/28 Flagyl 7/28 Remdesivir 7/28> 8/2 Tovilizumab 7/28  Interim history/subjective:  Overnight, no acute events reported. Patient remains sedated and mechanically ventilated   Objective   Blood pressure 116/70, pulse 79, temperature 98.7 F (37.1 C), temperature source Axillary, resp. rate (!) 29, height 5\' 7"  (1.702 m), weight 97.7 kg, SpO2 97 %.    Vent Mode: PRVC FiO2 (%):  [50 %-80 %] 50 % Set Rate:  [28 bmp] 28 bmp Vt Set:  [520 mL] 520 mL PEEP:  [12 cmH20] 12 cmH20 Plateau Pressure:  [29 cmH20-30 cmH20] 30 cmH20   Intake/Output Summary (Last 24 hours) at 07/12/2020 09/11/2020 Last data filed at 07/12/2020 09/11/2020 Gross per 24 hour  Intake 2213.69 ml  Output 345 ml  Net 1868.69 ml   Filed Weights   07/09/20 0352 07/10/20 0359 07/12/20 0405  Weight: 101.6 kg 101.3 kg 97.7 kg     Examination: General: critically ill appearing elderly gentleman; heavily sedated and mechanically ventilated  HENT: [place]/AT, anicteric sclerae, PERRL, ET tube in place  Lungs: diffuse rales bilaterally, on full ventilator support with FiO2 50% and PEEP 12 Cardiovascular: RRR, no m/r/g Abdomen: nondistended, soft, nontender, normoactive bowel sounds Extremities: warm/dry, nonedematous  Neuro: heavily sedated, PERRL  Resolved Hospital Problem list    Assessment & Plan:  Acute hypoxic respiratory failure secondary to moderate ARDS in setting of COVID 19 pneumonia P/F ratio of 150 this morning. Remains on full vent support. However, slightly improved from prior.  - Continue full ventilator support with goal O2 88-92%, Ppeak <30; wean as tolerated  - Currently does not meet proning parameters  - VAP per protocol   Acute toxic metabolic encephalopathy Currently on fentanyl and propofol gtt  - Goal to discontinue fentanyl gtt today, can give intermittent fentanyl bolus as needed  - Wean as tolerated for RASS goal -1 to -2  Acute on chronic renal insufficiency  Secondary hyperparathyroidism  Worsening renal function this AM with hyperkalemia, hyperphosphatemia and hypocalcemia. Elevated BUN/sCr and decreased urine output. Given one dose of bicarb.  - IV Lasix 100mg  x1 - IV calcium chloride 1g x1 - Repeat BMP this afternoon to assess renal function, consider nephrology consult  - Renvela 1600mg  tid until Phos <7.5, will decrease to 800mg  tid  - Avoid nephrotoxic agents as able   Acute LLE DVT Pulseless LLE: Unable to Doppler LLE pulse at this time . - LLE arterial 09/11/20, consider  vascular consult pending results  - Heparin dosing per pharmacy   Hyperglycemia in setting of diabetes mellitus: Currently uncontrolled on Levemir 12U bid + SSI Today is last day of decadron so will likely improve following this; will initiate tube feeds today now that has Cortrak in place  - Increase  levemir to 20u bid + SSI resistant  - Consider addition of tube feed coverage  Best practice:  Diet: tube feeds  Pain/Anxiety/Delirium protocol (if indicated): per protocol VAP protocol (if indicated): per protocol DVT prophylaxis: Heparin GI prophylaxis: Protonix Glucose control: Levemir + SSI Mobility: Bed rest  Code Status: FULL  Family Communication: will update daughter  Disposition: ICU  Labs    Critical care time: 40 minutes    Eliezer Bottom, MD Internal Medicine, PGY-2 07/12/20 8:21 AM Pager # [phone]

## 2020-07-12 NOTE — Procedures (Signed)
Cortrak  Person Inserting Tube:  Aneliese Beaudry, RD Tube Type:  Cortrak - 43 inches Tube Location:  Right nare Initial Placement:  Stomach Secured by: Bridle Technique Used to Measure Tube Placement:  Documented cm marking at nare/ corner of mouth Cortrak Secured At:  70 cm    No x-ray is required. RN may begin using tube.   If the tube becomes dislodged please keep the tube and contact the Cortrak team at [email or web address] (password [email or web address]) for replacement.  If after hours and replacement cannot be delayed, place a NG tube and confirm placement with an abdominal x-ray.    Tamiki Kuba RD, LDN Clinical Nutrition Pager listed in AMION   

## 2020-07-12 NOTE — Progress Notes (Signed)
eLink Physician-Brief Progress Note Patient Name: Gregory [name] DOB: September 18, 1951 MRN: [id number]   Date of Service  07/12/2020  HPI/Events of Note  Left IJ dialysis catheter ended up in the right IJ and needs to be re-positioned.  eICU Interventions  PCCM bedside requested to re-position the catheter over a wire.        Thomasene Lot Kerry Odonohue 07/12/2020, 8:13 PM

## 2020-07-12 NOTE — Progress Notes (Addendum)
Brief Nutrition Note:   Full assessment completed 8/5. Cortrak placed today, TF to be initiated  Pt remains sedated on vent, propofol at 12.2 ml/hr. Pt not requiring paralytic.   Noted CBGs in 200s-300s range prior to initiation of TF. Noted levemir added today, only sliding scale. Pt will likely require TF coverage with further insulin coverage including scheduled q 4 hour novolog to manage CBGs. RD discssed with DM Cooridnators who plan to evaluate pt and make further recommendations  Worsening renal function. Rise in BUN and Creatinine; pt oliguric   Mild hyperkalemia, noted order for lokelma. Hyperphosphatemia, Pt with orders for renvela which pt should start receiving now that pt has enteral access  Hypernatremia, plan to add free water flushes  Labs:  Recent Labs  Lab 07/10/20 0313 07/10/20 0313 07/10/20 2156 07/10/20 2219 07/11/20 0330 07/11/20 0330 07/11/20 1202 07/12/20 0432 07/12/20 0847  NA 148*   < > 139   < > 146*   < > 145 144 147*  K 5.2*   < > 5.2*   < > 5.1   < > 5.2* 6.2* 5.2*  CL 115*   < > 108  --  115*  --   --  112*  --   CO2 21*   < > 20*  --  20*  --   --  17*  --   BUN 83*   < > 93*  --  88*  --   --  109*  --   CREATININE 3.03*   < > 2.96*  --  2.64*  --   --  4.56*  --   CALCIUM 8.1*   < > 7.7*  --  7.9*  --   --  7.5*  --   MG 2.5*   < > 2.9*  --  2.9*  --   --  3.0*  --   PHOS 5.5*  --   --   --  7.3*  --   --  7.7*  --   GLUCOSE 152*   < > 351*  --  310*  --   --  287*  --    < > = values in this interval not displayed.    Interventions:   Tube Feeding via Cortrak:  Nepro at 45 ml/hr  Pro-Source TF 45 mL TID Provides 2064 kcals, 121 g of protein, 800 mL of free water  TF regimen and propofol at current rate providing 2386 total kcal/day  TF plus Propofol meets 100% estimated calorie and protein needs  Add Free water 250 q 4 hours plus TF: total free water 2300 mL  Add MVI with Minerals   Romelle Starcher MS, RDN, LDN, CNSC Registered  Dietitian III Clinical Nutrition RD Pager and On-Call Pager Number Located in [place]

## 2020-07-12 NOTE — Progress Notes (Signed)
Patient's daughter, Lesly Joslyn, updated via phone. Discussed continued ventilator requirements and worsening renal function. She is agreeable with dialysis if needed. Discussed guarded prognosis at this time. She expresses understanding. All questions/concerns addressed.  Eliezer Bottom, MD Internal Medicine, PGY-2 07/12/20 3:04 PM Pager # [phone]

## 2020-07-12 NOTE — Progress Notes (Signed)
Inpatient Diabetes Program Recommendations  AACE/ADA: New Consensus Statement on Inpatient Glycemic Control (2015)  Target Ranges:  Prepandial:   less than 140 mg/dL      Peak postprandial:   less than 180 mg/dL (1-2 hours)      Critically ill patients:  140 - 180 mg/dL   Lab Results  Component Value Date   GLUCAP 276 (H) 07/12/2020   HGBA1C 11.3 (H) 07/04/2020    Review of Glycemic Control Results for Gregory, [name] (MRN [id number]) as of 07/12/2020 10:54  Ref. Range 07/11/2020 16:17 07/11/2020 20:16 07/11/2020 23:40 07/12/2020 03:48 07/12/2020 08:05  Glucose-Capillary Latest Ref Range: 70 - 99 mg/dL 122 (H) 482 (H) 500 (H) 230 (H) 276 (H)   Inpatient Diabetes Program Recommendations:   As tube feed is restarted, -Add Novolog tube feed coverage starting with 2 units q 4 hrs and increase as needed (hold if tube feed held or stopped for any reason)  Thank you, Darel Hong E. Dianara Smullen, RN, MSN, CDE  Diabetes Coordinator Inpatient Glycemic Control Team Team Pager [phone] (8am-5pm) 07/12/2020 10:59 AM

## 2020-07-12 NOTE — Progress Notes (Signed)
Pt bleeding from mouth about 50cc after dialysis catheter placed, Heparin gtt turned off, MD aware.

## 2020-07-12 NOTE — Progress Notes (Signed)
Assisted tele visit to patient with family member.  Giliana Vantil DNP [facility] RN   

## 2020-07-12 NOTE — Progress Notes (Signed)
ANTICOAGULATION CONSULT NOTE - Follow Up Consult  Pharmacy Consult for Heparin Indication: BL DVT  No Known Allergies  Patient Measurements: Height: 5\' 7"  (170.2 cm) Weight: 97.7 kg (215 lb 6.2 oz) IBW/kg (Calculated) : 66.1 Heparin Dosing Weight: 88kg  Vital Signs: Temp: 99.2 F (37.3 C) (08/06 1130) Temp Source: Oral (08/06 1130) BP: 99/61 (08/06 1400) Pulse Rate: 84 (08/06 1440)  Labs: Recent Labs    07/10/20 2156 07/10/20 2219 07/11/20 0330 07/11/20 0330 07/11/20 1202 07/11/20 1202 07/11/20 1446 07/11/20 2103 07/12/20 0432 07/12/20 0847 07/12/20 1208 07/12/20 1516  HGB 12.7*   < > 12.4*   < > 11.9*   < >  --   --  11.8* 9.2*  --   --   HCT 40.1   < > 39.6   < > 35.0*  --   --   --  36.8* 27.0*  --   --   PLT 94*  --  113*  --   --   --   --   --  77*  --   --   --   APTT >200*  --   --   --   --   --    < > 125* 121*  --   --  105*  HEPARINUNFRC >2.20*  --   --   --   --   --   --   --  1.40*  --   --   --   CREATININE 2.96*   < > 2.64*  --   --   --   --   --  4.56*  --  4.72*  --    < > = values in this interval not displayed.    Estimated Creatinine Clearance: 16.7 mL/min (A) (by C-G formula based on SCr of 4.72 mg/dL (H)).    Assessment: Anticoag: Eliquis for acute bilateral DVT (received 3 doses, LD 8/3 AM), d-dimer >20 >> Heparin since NPO/AMS -  APTT 105 (GOAL 66-102). Drawn from R ART line, heparin infusion in R IJ.   Goal of Therapy:  aPTT 66-102 seconds Monitor platelets by anticoagulation protocol: Yes   Plan:  Decrease heparin to 650 units/hr and recheck in AM.  Monitor for signs ans symptoms of bleeding, qam aPTT, HL and CBC  09/11/20, PharmD, [facility] Clinical Pharmacist Please see AMION for all Pharmacists' Contact Phone Numbers 07/12/2020, 4:33 PM

## 2020-07-12 NOTE — Procedures (Signed)
Central Venous Catheter Insertion Procedure Note  Gregory [name]  [id number]  [birth year]  Date:07/12/20  Time:5:30 PM   Provider Performing:Cassundra Mckeever   Procedure: Insertion of Non-tunneled Central Venous Catheter(36556)with US guidance (59935)    Indication(s) Hemodialysis  Consent Risks of the procedure as well as the alternatives and risks of each were explained to the patient and/or caregiver.  Consent for the procedure was obtained and is signed in the bedside chart  Anesthesia Topical only with 1% lidocaine   Timeout Verified patient identification, verified procedure, site/side was marked, verified correct patient position, special equipment/implants available, medications/allergies/relevant history reviewed, required imaging and test results available.  Sterile Technique Maximal sterile technique including full sterile barrier drape, hand hygiene, sterile gown, sterile gloves, mask, hair covering, sterile ultrasound probe cover (if used).  Procedure Description Area of catheter insertion was cleaned with chlorhexidine and draped in sterile fashion.   With real-time ultrasound guidance a HD catheter was placed into the left internal jugular vein.  Nonpulsatile blood flow and easy flushing noted in all ports.  The catheter was sutured in place and sterile dressing applied.  Complications/Tolerance None; patient tolerated the procedure well. Chest X-ray is ordered to verify placement for internal jugular or subclavian cannulation.  Chest x-ray is not ordered for femoral cannulation.  EBL Minimal  Specimen(s) None

## 2020-07-12 NOTE — Consult Note (Signed)
[facility] Renal Consultation Note  Requesting MD: Chand Indication for Consultation: A on CRF  HPI:  Gregory [name] is a 69 y.o. male with no medical record in the [facility] system- is followed at the [place].  He reportedly has a history of hypertension-on ARB, type 2 diabetes mellitus, gout and remote stroke.  He was brought into the hospital on 7/28 with shortness of breath and hypoxia.  Diagnosed with Covid pneumonia, intubated-started on appropriate Covid treatment.  Presenting creatinine was 3.8 that actually improved slowly and was at a level of 2.6 on 07/11/2020-had actually improved, was out of ICU but then had acute worsening in clinical status on 8/3 and was transferred back to the ICU-required reintubation-has had hypotension, started on pressors-dosing escalated quickly but hemodynamics do seem improved over the last 12 hours.  Creatinine is worsened over the last 48 hours-up to 4.7 on most recent check- UOP has declined.  There is also been an issue with hyperkalemia, peak of 6.2-most recent 5.5  Creatinine, Ser  Date/Time Value Ref Range Status  07/12/2020 12:08 PM 4.72 (H) 0.61 - 1.24 mg/dL Final  50/38/8828 00:34 AM 4.56 (H) 0.61 - 1.24 mg/dL Final    Comment:    DELTA CHECK NOTED  07/11/2020 03:30 AM 2.64 (H) 0.61 - 1.24 mg/dL Final  91/79/1505 69:79 PM 2.96 (H) 0.61 - 1.24 mg/dL Final  48/12/6551 74:82 AM 3.03 (H) 0.61 - 1.24 mg/dL Final  70/78/6754 49:20 AM 2.64 (H) 0.61 - 1.24 mg/dL Final  09/12/1218 75:88 AM 2.82 (H) 0.61 - 1.24 mg/dL Final  32/54/9826 41:58 AM 3.11 (H) 0.61 - 1.24 mg/dL Final  30/94/0768 08:81 AM 3.07 (H) 0.61 - 1.24 mg/dL Final  10/06/5944 85:92 PM 3.17 (H) 0.61 - 1.24 mg/dL Final  92/44/6286 38:17 AM 2.99 (H) 0.61 - 1.24 mg/dL Final  71/16/5790 38:33 PM 3.44 (H) 0.61 - 1.24 mg/dL Final  38/32/9191 66:06 AM 3.72 (H) 0.61 - 1.24 mg/dL Final  00/45/9977 41:42 PM 3.81 (H) 0.61 - 1.24 mg/dL Final     PMHx:  History reviewed. No pertinent past  medical history.    Family Hx: History reviewed. No pertinent family history.  Social History:  has no history on file for tobacco use, alcohol use, and drug use.  Allergies: No Known Allergies  Medications: Prior to Admission medications   Medication Sig Start Date End Date Taking? Authorizing Provider  albuterol (VENTOLIN HFA) 108 (90 Base) MCG/ACT inhaler Inhale 2 puffs into the lungs every 6 (six) hours as needed for wheezing or shortness of breath.   Yes [provider]  atorvastatin (LIPITOR) 20 MG tablet Take 20 mg by mouth at bedtime.   Yes [provider]  carvedilol (COREG) 25 MG tablet Take 25 mg by mouth 2 (two) times daily with a meal.   Yes [provider]  furosemide (LASIX) 40 MG tablet Take 40 mg by mouth daily. 1 additional tablet in the evening if needed for edema   Yes [provider]  losartan (COZAAR) 100 MG tablet Take 100 mg by mouth daily.   Yes [provider]  metFORMIN (GLUCOPHAGE) 500 MG tablet Take 500 mg by mouth 2 (two) times daily with a meal.   Yes [provider]  OXYBUTYNIN CHLORIDE PO Take 5 mg by mouth daily.   Yes [provider]  potassium chloride (KLOR-CON) 10 MEQ tablet Take 20 mEq by mouth 2 (two) times daily.   Yes [provider]    I have reviewed the  patient's current medications.  Labs:  Results for orders placed or performed during the hospital encounter of 28-Jul-2020 (from the past 48 hour(s))  Glucose, capillary     Status: Abnormal   Collection Time: 07/10/20  9:37 PM  Result Value Ref Range   Glucose-Capillary 294 (H) 70 - 99 mg/dL    Comment: Glucose reference range applies only to samples taken after fasting for at least 8 hours.  APTT     Status: Abnormal   Collection Time: 07/10/20  9:56 PM  Result Value Ref Range   aPTT >200 (HH) 24 - 36 seconds    Comment:        IF BASELINE aPTT IS ELEVATED, SUGGEST PATIENT RISK ASSESSMENT BE USED TO DETERMINE  APPROPRIATE ANTICOAGULANT THERAPY. REPEATED TO VERIFY Ander Purpura, RN 07/10/20 2309 [place] CRITICAL RESULT CALLED TO, READ BACK BY AND VERIFIED WITH: Performed at [facility] Lab, [address] [place], [place] [address] CORRECTED ON 08/05 AT 0957: PREVIOUSLY REPORTED AS >200        IF BASELINE aPTT IS ELEVATED, SUGGEST PATIENT RISK ASSESSMENT BE USED TO DETERMINE APPROPRIATE ANTICOAGULANT THERAPY. REPEATED TO VERIFY Ander Purpura, RN 07/10/20 2309 BTA[place]OR   Heparin level (unfractionated)     Status: Abnormal   Collection Time: 07/10/20  9:56 PM  Result Value Ref Range   Heparin Unfractionated >2.20 (H) 0.30 - 0.70 IU/mL    Comment: RESULTS CONFIRMED BY MANUAL DILUTION (NOTE) If heparin results are below expected values, and patient dosage has  been confirmed, suggest follow up testing of antithrombin III levels. Performed at [facility] Lab, [address], [place], [place] [address]   CBC     Status: Abnormal   Collection Time: 07/10/20  9:56 PM  Result Value Ref Range   WBC 4.8 4.0 - 10.5 K/uL   RBC 4.44 4.22 - 5.81 MIL/uL   Hemoglobin 12.7 (L) 13.0 - 17.0 g/dL   HCT 32.4 39 - 52 %   MCV 90.3 80.0 - 100.0 fL   MCH 28.6 26.0 - 34.0 pg   MCHC 31.7 30.0 - 36.0 g/dL   RDW 40.1 02.7 - 25.3 %   Platelets 94 (L) 150 - 400 K/uL    Comment: REPEATED TO VERIFY Immature Platelet Fraction may be clinically indicated, consider ordering this additional test [id number]    nRBC 0.6 (H) 0.0 - 0.2 %    Comment: Performed at [facility] Lab, [address], [place], [place] [address]  Basic metabolic panel     Status: Abnormal   Collection Time: 07/10/20  9:56 PM  Result Value Ref Range   Sodium 139 135 - 145 mmol/L   Potassium 5.2 (H) 3.5 - 5.1 mmol/L   Chloride 108 98 - 111 mmol/L   CO2 20 (L) 22 - 32 mmol/L   Glucose, Bld 351 (H) 70 - 99 mg/dL    Comment: Glucose reference range applies only to samples taken after fasting for at least 8 hours.   BUN 93 (H) 8 - 23 mg/dL    Creatinine, Ser 6.38 (H) 0.61 - 1.24 mg/dL   Calcium 7.7 (L) 8.9 - 10.3 mg/dL   GFR calc non Af Amer 21 (L) >60 mL/min   GFR calc Af Amer 24 (L) >60 mL/min   Anion gap 11 5 - 15    Comment: Performed at [facility] Lab, [address] [place], [place] [address]  Magnesium     Status: Abnormal   Collection Time: 07/10/20  9:56 PM  Result Value Ref Range   Magnesium 2.9 (H) 1.7 - 2.4 mg/dL    Comment: Performed at [facility]  Lab, [address] [place], [place] [id number]  I-STAT 7, (LYTES, BLD GAS, ICA, H+H)     Status: Abnormal   Collection Time: 07/10/20 10:19 PM  Result Value Ref Range   pH, Arterial 7.329 (L) 7.35 - 7.45   pCO2 arterial 36.0 32 - 48 mmHg   pO2, Arterial 42 (L) 83 - 108 mmHg   Bicarbonate 19.2 (L) 20.0 - 28.0 mmol/L   TCO2 20 (L) 22 - 32 mmol/L   O2 Saturation 78.0 %   Acid-base deficit 6.0 (H) 0.0 - 2.0 mmol/L   Sodium 147 (H) 135 - 145 mmol/L   Potassium 5.2 (H) 3.5 - 5.1 mmol/L   Calcium, Ion 1.17 1.15 - 1.40 mmol/L   HCT 34.0 (L) 39 - 52 %   Hemoglobin 11.6 (L) 13.0 - 17.0 g/dL   Patient temperature 60.496.6 F    Collection site Radial    Drawn by Operator    Sample type ARTERIAL   I-STAT 7, (LYTES, BLD GAS, ICA, H+H)     Status: Abnormal   Collection Time: 07/10/20 10:26 PM  Result Value Ref Range   pH, Arterial 7.329 (L) 7.35 - 7.45   pCO2 arterial 36.8 32 - 48 mmHg   pO2, Arterial 43 (L) 83 - 108 mmHg   Bicarbonate 19.6 (L) 20.0 - 28.0 mmol/L   TCO2 21 (L) 22 - 32 mmol/L   O2 Saturation 79.0 %   Acid-base deficit 6.0 (H) 0.0 - 2.0 mmol/L   Sodium 147 (H) 135 - 145 mmol/L   Potassium 5.1 3.5 - 5.1 mmol/L   Calcium, Ion 1.15 1.15 - 1.40 mmol/L   HCT 34.0 (L) 39 - 52 %   Hemoglobin 11.6 (L) 13.0 - 17.0 g/dL   Patient temperature 54.096.6 F    Collection site Radial    Drawn by Operator    Sample type ARTERIAL   I-STAT 7, (LYTES, BLD GAS, ICA, H+H)     Status: Abnormal   Collection Time: 07/11/20  3:17 AM  Result Value Ref Range   pH, Arterial  7.170 (LL) 7.35 - 7.45   pCO2 arterial 56.5 (H) 32 - 48 mmHg   pO2, Arterial 131 (H) 83 - 108 mmHg   Bicarbonate 20.6 20.0 - 28.0 mmol/L   TCO2 22 22 - 32 mmol/L   O2 Saturation 98.0 %   Acid-base deficit 8.0 (H) 0.0 - 2.0 mmol/L   Sodium 147 (H) 135 - 145 mmol/L   Potassium 5.0 3.5 - 5.1 mmol/L   Calcium, Ion 1.22 1.15 - 1.40 mmol/L   HCT 36.0 (L) 39 - 52 %   Hemoglobin 12.2 (L) 13.0 - 17.0 g/dL   Sample type ARTERIAL   Comprehensive metabolic panel     Status: Abnormal   Collection Time: 07/11/20  3:30 AM  Result Value Ref Range   Sodium 146 (H) 135 - 145 mmol/L   Potassium 5.1 3.5 - 5.1 mmol/L   Chloride 115 (H) 98 - 111 mmol/L   CO2 20 (L) 22 - 32 mmol/L   Glucose, Bld 310 (H) 70 - 99 mg/dL    Comment: Glucose reference range applies only to samples taken after fasting for at least 8 hours.   BUN 88 (H) 8 - 23 mg/dL   Creatinine, Ser 9.812.64 (H) 0.61 - 1.24 mg/dL   Calcium 7.9 (L) 8.9 -  10.3 mg/dL   Total Protein 5.1 (L) 6.5 - 8.1 g/dL   Albumin 2.1 (L) 3.5 - 5.0 g/dL   AST 51 (H) 15 - 41 U/L   ALT 31 0 - 44 U/L   Alkaline Phosphatase 131 (H) 38 - 126 U/L   Total Bilirubin 0.4 0.3 - 1.2 mg/dL   GFR calc non Af Amer 24 (L) >60 mL/min   GFR calc Af Amer 28 (L) >60 mL/min   Anion gap 11 5 - 15    Comment: Performed at [facility] Lab, [address], [place], [place] [address]  Magnesium     Status: Abnormal   Collection Time: 07/11/20  3:30 AM  Result Value Ref Range   Magnesium 2.9 (H) 1.7 - 2.4 mg/dL    Comment: Performed at [facility] Lab, [address], [place], [place] [address]  Phosphorus     Status: Abnormal   Collection Time: 07/11/20  3:30 AM  Result Value Ref Range   Phosphorus 7.3 (H) 2.5 - 4.6 mg/dL    Comment: Performed at [facility] Lab, [address], [place], [place] [address]  CBC     Status: Abnormal   Collection Time: 07/11/20  3:30 AM  Result Value Ref Range   WBC 5.4 4.0 - 10.5 K/uL   RBC 4.37 4.22 - 5.81 MIL/uL   Hemoglobin 12.4 (L)  13.0 - 17.0 g/dL   HCT 91.4 39 - 52 %   MCV 90.6 80.0 - 100.0 fL   MCH 28.4 26.0 - 34.0 pg   MCHC 31.3 30.0 - 36.0 g/dL   RDW 78.2 95.6 - 21.3 %   Platelets 113 (L) 150 - 400 K/uL    Comment: REPEATED TO VERIFY Immature Platelet Fraction may be clinically indicated, consider ordering this additional test [id number]    nRBC 0.6 (H) 0.0 - 0.2 %    Comment: Performed at [facility] Lab, [address], [place], [place] [address]  Triglycerides     Status: Abnormal   Collection Time: 07/11/20  3:30 AM  Result Value Ref Range   Triglycerides 249 (H) <150 mg/dL    Comment: Performed at [facility] Lab, [address], [place], [place] [address]  Lactic acid, plasma     Status: Abnormal   Collection Time: 07/11/20  3:39 AM  Result Value Ref Range   Lactic Acid, Venous 2.8 (HH) 0.5 - 1.9 mmol/L    Comment: CRITICAL RESULT CALLED TO, READ BACK BY AND VERIFIED WITH: RN J JONE  07/11/20 BY S GEZAHEGN Performed at [facility] Lab, [address], [place], [place] [address]   Glucose, capillary     Status: Abnormal   Collection Time: 07/11/20  3:58 AM  Result Value Ref Range   Glucose-Capillary 295 (H) 70 - 99 mg/dL    Comment: Glucose reference range applies only to samples taken after fasting for at least 8 hours.  Glucose, capillary     Status: Abnormal   Collection Time: 07/11/20  7:12 AM  Result Value Ref Range   Glucose-Capillary 256 (H) 70 - 99 mg/dL    Comment: Glucose reference range applies only to samples taken after fasting for at least 8 hours.  Glucose, capillary     Status: Abnormal   Collection Time: 07/11/20 11:39 AM  Result Value Ref Range   Glucose-Capillary 236 (H) 70 - 99 mg/dL    Comment: Glucose reference range applies only to samples taken after fasting for  at least 8 hours.   Comment 1 Notify RN   I-STAT 7, (LYTES, BLD GAS, ICA, H+H)     Status: Abnormal   Collection Time: 07/11/20 12:02 PM  Result Value Ref Range   pH, Arterial 7.238 (L) 7.35 -  7.45   pCO2 arterial 47.8 32 - 48 mmHg   pO2, Arterial 82 (L) 83 - 108 mmHg   Bicarbonate 20.3 20.0 - 28.0 mmol/L   TCO2 22 22 - 32 mmol/L   O2 Saturation 93.0 %   Acid-base deficit 7.0 (H) 0.0 - 2.0 mmol/L   Sodium 145 135 - 145 mmol/L   Potassium 5.2 (H) 3.5 - 5.1 mmol/L   Calcium, Ion 1.14 (L) 1.15 - 1.40 mmol/L   HCT 35.0 (L) 39 - 52 %   Hemoglobin 11.9 (L) 13.0 - 17.0 g/dL   Patient temperature 16.1 F    Collection site Radial    Drawn by RT    Sample type ARTERIAL   APTT     Status: Abnormal   Collection Time: 07/11/20  2:46 PM  Result Value Ref Range   aPTT 98 (H) 24 - 36 seconds    Comment:        IF BASELINE aPTT IS ELEVATED, SUGGEST PATIENT RISK ASSESSMENT BE USED TO DETERMINE APPROPRIATE ANTICOAGULANT THERAPY. Performed at [facility] Lab, [address], [place], [place] [address]   Glucose, capillary     Status: Abnormal   Collection Time: 07/11/20  4:17 PM  Result Value Ref Range   Glucose-Capillary 163 (H) 70 - 99 mg/dL    Comment: Glucose reference range applies only to samples taken after fasting for at least 8 hours.  Glucose, capillary     Status: Abnormal   Collection Time: 07/11/20  8:16 PM  Result Value Ref Range   Glucose-Capillary 163 (H) 70 - 99 mg/dL    Comment: Glucose reference range applies only to samples taken after fasting for at least 8 hours.  APTT     Status: Abnormal   Collection Time: 07/11/20  9:03 PM  Result Value Ref Range   aPTT 125 (H) 24 - 36 seconds    Comment:        IF BASELINE aPTT IS ELEVATED, SUGGEST PATIENT RISK ASSESSMENT BE USED TO DETERMINE APPROPRIATE ANTICOAGULANT THERAPY. Performed at [facility] Lab, [address], [place], [place] [address]   Lactic acid, plasma     Status: Abnormal   Collection Time: 07/11/20  9:03 PM  Result Value Ref Range   Lactic Acid, Venous 2.5 (HH) 0.5 - 1.9 mmol/L    Comment: CRITICAL VALUE NOTED.  VALUE IS CONSISTENT WITH PREVIOUSLY REPORTED AND CALLED VALUE. Performed at  [facility] Lab, [address], [place], [place] [address]   Glucose, capillary     Status: Abnormal   Collection Time: 07/11/20 11:40 PM  Result Value Ref Range   Glucose-Capillary 216 (H) 70 - 99 mg/dL    Comment: Glucose reference range applies only to samples taken after fasting for at least 8 hours.  Glucose, capillary     Status: Abnormal   Collection Time: 07/12/20  3:48 AM  Result Value Ref Range   Glucose-Capillary 230 (H) 70 - 99 mg/dL    Comment: Glucose reference range applies only to samples taken after fasting for at least 8 hours.  Comprehensive metabolic panel     Status: Abnormal   Collection Time: 07/12/20  4:32 AM  Result Value Ref Range  Sodium 144 135 - 145 mmol/L   Potassium 6.2 (H) 3.5 - 5.1 mmol/L   Chloride 112 (H) 98 - 111 mmol/L   CO2 17 (L) 22 - 32 mmol/L   Glucose, Bld 287 (H) 70 - 99 mg/dL    Comment: Glucose reference range applies only to samples taken after fasting for at least 8 hours.   BUN 109 (H) 8 - 23 mg/dL   Creatinine, Ser 0.93 (H) 0.61 - 1.24 mg/dL    Comment: DELTA CHECK NOTED   Calcium 7.5 (L) 8.9 - 10.3 mg/dL   Total Protein 4.9 (L) 6.5 - 8.1 g/dL   Albumin 1.9 (L) 3.5 - 5.0 g/dL   AST 43 (H) 15 - 41 U/L   ALT 24 0 - 44 U/L   Alkaline Phosphatase 105 38 - 126 U/L   Total Bilirubin 0.7 0.3 - 1.2 mg/dL   GFR calc non Af Amer 12 (L) >60 mL/min   GFR calc Af Amer 14 (L) >60 mL/min   Anion gap 15 5 - 15    Comment: Performed at [facility] Lab, [address], [place], [place] [address]  Magnesium     Status: Abnormal   Collection Time: 07/12/20  4:32 AM  Result Value Ref Range   Magnesium 3.0 (H) 1.7 - 2.4 mg/dL    Comment: Performed at [facility] Lab, [address], [place], [place] [address]  Phosphorus     Status: Abnormal   Collection Time: 07/12/20  4:32 AM  Result Value Ref Range   Phosphorus 7.7 (H) 2.5 - 4.6 mg/dL    Comment: Performed at [facility] Lab, [address], [place], [place] [address]  CBC      Status: Abnormal   Collection Time: 07/12/20  4:32 AM  Result Value Ref Range   WBC 7.3 4.0 - 10.5 K/uL   RBC 4.01 (L) 4.22 - 5.81 MIL/uL   Hemoglobin 11.8 (L) 13.0 - 17.0 g/dL   HCT 62.3 (L) 39 - 52 %   MCV 91.8 80.0 - 100.0 fL   MCH 29.4 26.0 - 34.0 pg   MCHC 32.1 30.0 - 36.0 g/dL   RDW 76.2 83.1 - 51.7 %   Platelets 77 (L) 150 - 400 K/uL    Comment: REPEATED TO VERIFY SPECIMEN CHECKED FOR CLOTS Immature Platelet Fraction may be clinically indicated, consider ordering this additional test [id number] CONSISTENT WITH PREVIOUS RESULT    nRBC 1.0 (H) 0.0 - 0.2 %    Comment: Performed at [facility] Lab, [address], [place], [place] [address]  APTT     Status: Abnormal   Collection Time: 07/12/20  4:32 AM  Result Value Ref Range   aPTT 121 (H) 24 - 36 seconds    Comment:        IF BASELINE aPTT IS ELEVATED, SUGGEST PATIENT RISK ASSESSMENT BE USED TO DETERMINE APPROPRIATE ANTICOAGULANT THERAPY. Performed at [facility] Lab, [address] [place], [place] [address]   Heparin level (unfractionated)     Status: Abnormal   Collection Time: 07/12/20  4:32 AM  Result Value Ref Range   Heparin Unfractionated 1.40 (H) 0.30 - 0.70 IU/mL    Comment: RESULTS CONFIRMED BY MANUAL DILUTION (NOTE) If heparin results are below expected values, and patient dosage has  been confirmed, suggest follow up testing of antithrombin III levels. Performed at [facility] Lab, [address], [place], [place] [address]   Triglycerides  Status: Abnormal   Collection Time: 07/12/20  4:32 AM  Result Value Ref Range   Triglycerides 167 (H) <150 mg/dL    Comment: Performed at [facility] Lab, [address], [place], [place] [address]  Glucose, capillary     Status: Abnormal   Collection Time: 07/12/20  8:05 AM  Result Value Ref Range   Glucose-Capillary 276 (H) 70 - 99 mg/dL    Comment: Glucose reference range applies only to samples taken after fasting for at least 8 hours.   I-STAT 7, (LYTES, BLD GAS, ICA, H+H)     Status: Abnormal   Collection Time: 07/12/20  8:47 AM  Result Value Ref Range   pH, Arterial 7.297 (L) 7.35 - 7.45   pCO2 arterial 41.9 32 - 48 mmHg   pO2, Arterial 60 (L) 83 - 108 mmHg   Bicarbonate 20.3 20.0 - 28.0 mmol/L   TCO2 22 22 - 32 mmol/L   O2 Saturation 87.0 %   Acid-base deficit 6.0 (H) 0.0 - 2.0 mmol/L   Sodium 147 (H) 135 - 145 mmol/L   Potassium 5.2 (H) 3.5 - 5.1 mmol/L   Calcium, Ion 1.01 (L) 1.15 - 1.40 mmol/L   HCT 27.0 (L) 39 - 52 %   Hemoglobin 9.2 (L) 13.0 - 17.0 g/dL   Patient temperature 51.8 F    Sample type ARTERIAL   Glucose, capillary     Status: Abnormal   Collection Time: 07/12/20 12:02 PM  Result Value Ref Range   Glucose-Capillary 226 (H) 70 - 99 mg/dL    Comment: Glucose reference range applies only to samples taken after fasting for at least 8 hours.  Basic metabolic panel     Status: Abnormal   Collection Time: 07/12/20 12:08 PM  Result Value Ref Range   Sodium 144 135 - 145 mmol/L   Potassium 5.5 (H) 3.5 - 5.1 mmol/L   Chloride 112 (H) 98 - 111 mmol/L   CO2 20 (L) 22 - 32 mmol/L   Glucose, Bld 241 (H) 70 - 99 mg/dL    Comment: Glucose reference range applies only to samples taken after fasting for at least 8 hours.   BUN 114 (H) 8 - 23 mg/dL   Creatinine, Ser 8.41 (H) 0.61 - 1.24 mg/dL   Calcium 8.1 (L) 8.9 - 10.3 mg/dL   GFR calc non Af Amer 12 (L) >60 mL/min   GFR calc Af Amer 14 (L) >60 mL/min   Anion gap 12 5 - 15    Comment: Performed at [facility] Lab, [address] [place], [place] [address]  Magnesium     Status: Abnormal   Collection Time: 07/12/20 12:08 PM  Result Value Ref Range   Magnesium 2.9 (H) 1.7 - 2.4 mg/dL    Comment: Performed at [facility] Lab, [address], [place], [place] [address]  Phosphorus     Status: Abnormal   Collection Time: 07/12/20 12:08 PM  Result Value Ref Range   Phosphorus 7.3 (H) 2.5 - 4.6 mg/dL    Comment: Performed at [facility]  Lab, [address] [place], [place] [address]  Lactic acid, plasma     Status: Abnormal   Collection Time: 07/12/20 12:15 PM  Result Value Ref Range   Lactic Acid, Venous 2.5 (HH) 0.5 - 1.9 mmol/L    Comment: CRITICAL VALUE NOTED.  VALUE IS CONSISTENT WITH PREVIOUSLY REPORTED AND CALLED VALUE. Performed at [facility] Lab, [address], [place], [place]  [address]      ROS:  Review of systems not obtained due to patient factors.  Physical Exam: Vitals:   07/12/20 1400 07/12/20 1440  BP: 99/61   Pulse: 88 84  Resp: 18 (!) 25  Temp:    SpO2: (!) 89% 97%      Patient visualized through a clear glass door-  Elements of exam reviewed at CCM note   General: Sedated, on vent Heart: RRR Lungs: CBS bilat Abdomen: soft, non tender Extremities: min edema Neuro: on sedation   Assessment/Plan: 69 year old BM- appears to have CKD at baseline but unclear to what degree- initial urine with protein-  3 grams - normal sized kidneys c/w medical renal disease-  At best crt has been 2.6.  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  Oliguric and BUN of over 100-  Issues with hyperkalemia.  Family desires full scope of care.  Would not be surprised if progresses to point of needing HD. I have discussed with Dr. Merrily Pew as patient will need hemodialysis access- might be better to do on Friday instead of Saturday -  Will put on scheduled lokelma and check another potassium later and follow closely  2. Hyperkalemia-  Start scheduled lokelma and follow-  Might be our indication for HD 3. Hypertension/volume  - I's and O's actually negative overall but positive the last couple of days with oliguria.  Does not seem to be volume overloaded-   Seems euvolemic 4. Anemia  - had not been issue-  Supportive care    Cecille Aver 07/12/2020, 4:08 PM

## 2020-07-12 NOTE — Progress Notes (Signed)
ANTICOAGULATION CONSULT NOTE - Follow Up Consult  Pharmacy Consult for hepain Indication: DVT  Labs: Recent Labs    07/10/20 0313 07/10/20 0313 07/10/20 2156 07/10/20 2156 07/10/20 2219 07/11/20 0330 07/11/20 0330 07/11/20 1202 07/11/20 1446 07/11/20 2103 07/12/20 0432  HGB 12.4*   < > 12.7*  --    < > 12.4*   < > 11.9*  --   --  11.8*  HCT 37.7*   < > 40.1  --    < > 39.6  --  35.0*  --   --  36.8*  PLT 101*   < > 94*  --   --  113*  --   --   --   --  77*  APTT  --   --  >200*   < >  --   --   --   --  98* 125* 121*  HEPARINUNFRC  --   --  >2.20*  --   --   --   --   --   --   --   --   CREATININE 3.03*  --  2.96*  --   --  2.64*  --   --   --   --   --    < > = values in this interval not displayed.    Assessment: 69yo male supratherapeutic on heparin with little change in PTT despite rate change; no gtt issues or signs of bleeding per RN; pt on low rate for body size.  Goal of Therapy:  aPTT 66-102 seconds   Plan:  Will decrease heparin gtt by 1 units/kg/hr to 700 units/hr and check level in 8 hours.    Vernard Gambles, PharmD, BCPS  07/12/2020,5:48 AM

## 2020-07-12 NOTE — Procedures (Addendum)
Central Venous Catheter Insertion Procedure Note  Gregory [name]  [id number]  1951-02-28  Date:07/12/20  Time:9:42 PM   Provider Performing:Laura R Gleason   Procedure: Insertion of Non-tunneled Central Venous Catheter(36556)with US guidance (02111)   24cm Trialysis Catheter  Indication(s) Hemodialysis  Consent Unable to obtain consent due to emergent nature of procedure.  Anesthesia Topical only with 1% lidocaine   Timeout Verified patient identification, verified procedure, site/side was marked, verified correct patient position, special equipment/implants available, medications/allergies/relevant history reviewed, required imaging and test results available.  Sterile Technique Maximal sterile technique including full sterile barrier drape, hand hygiene, sterile gown, sterile gloves, mask, hair covering, sterile ultrasound probe cover (if used).  Procedure Description Area of catheter insertion was cleaned with chlorhexidine and draped in sterile fashion.   With real-time ultrasound guidance a HD catheter was placed into the left femoral vein.  Nonpulsatile blood flow and easy flushing noted in all ports.  The catheter was sutured in place and sterile dressing applied.  Complications/Tolerance None; patient tolerated the procedure well. Chest X-ray is ordered to verify placement for internal jugular or subclavian cannulation.  Chest x-ray is not ordered for femoral cannulation.  EBL Minimal  Specimen(s) None  Vernona Rieger R Gleason, PA-C   I was present during [place] directed dialysis catheter insertion.  No complications.  I was present during [place] directed dialysis cather

## 2020-07-13 ENCOUNTER — Encounter (HOSPITAL_COMMUNITY): Payer: No Typology Code available for payment source

## 2020-07-13 DIAGNOSIS — G9340 Encephalopathy, unspecified: Secondary | ICD-10-CM

## 2020-07-13 DIAGNOSIS — Z7189 Other specified counseling: Secondary | ICD-10-CM

## 2020-07-13 DIAGNOSIS — J80 Acute respiratory distress syndrome: Secondary | ICD-10-CM

## 2020-07-13 DIAGNOSIS — Z515 Encounter for palliative care: Secondary | ICD-10-CM

## 2020-07-13 LAB — POCT I-STAT 7, (LYTES, BLD GAS, ICA,H+H)
Acid-base deficit: 5 mmol/L — ABNORMAL HIGH (ref 0.0–2.0)
Bicarbonate: 21.7 mmol/L (ref 20.0–28.0)
Calcium, Ion: 1.05 mmol/L — ABNORMAL LOW (ref 1.15–1.40)
HCT: 27 % — ABNORMAL LOW (ref 39.0–52.0)
Hemoglobin: 9.2 g/dL — ABNORMAL LOW (ref 13.0–17.0)
O2 Saturation: 91 %
Patient temperature: 98.7
Potassium: 5.5 mmol/L — ABNORMAL HIGH (ref 3.5–5.1)
Sodium: 142 mmol/L (ref 135–145)
TCO2: 23 mmol/L (ref 22–32)
pCO2 arterial: 45.7 mmHg (ref 32.0–48.0)
pH, Arterial: 7.285 — ABNORMAL LOW (ref 7.350–7.450)
pO2, Arterial: 68 mmHg — ABNORMAL LOW (ref 83.0–108.0)

## 2020-07-13 LAB — LACTIC ACID, PLASMA: Lactic Acid, Venous: 1.6 mmol/L (ref 0.5–1.9)

## 2020-07-13 LAB — COMPREHENSIVE METABOLIC PANEL
ALT: 19 U/L (ref 0–44)
AST: 43 U/L — ABNORMAL HIGH (ref 15–41)
Albumin: 1.8 g/dL — ABNORMAL LOW (ref 3.5–5.0)
Alkaline Phosphatase: 89 U/L (ref 38–126)
Anion gap: 14 (ref 5–15)
BUN: 133 mg/dL — ABNORMAL HIGH (ref 8–23)
CO2: 20 mmol/L — ABNORMAL LOW (ref 22–32)
Calcium: 7.7 mg/dL — ABNORMAL LOW (ref 8.9–10.3)
Chloride: 109 mmol/L (ref 98–111)
Creatinine, Ser: 5.58 mg/dL — ABNORMAL HIGH (ref 0.61–1.24)
GFR calc Af Amer: 11 mL/min — ABNORMAL LOW (ref >60)
GFR calc non Af Amer: 10 mL/min — ABNORMAL LOW (ref >60)
Glucose, Bld: 346 mg/dL — ABNORMAL HIGH (ref 70–99)
Potassium: 5.7 mmol/L — ABNORMAL HIGH (ref 3.5–5.1)
Sodium: 143 mmol/L (ref 135–145)
Total Bilirubin: 0.3 mg/dL (ref 0.3–1.2)
Total Protein: 4.6 g/dL — ABNORMAL LOW (ref 6.5–8.1)

## 2020-07-13 LAB — HEPATITIS B SURFACE ANTIBODY,QUALITATIVE: Hep B S Ab: NONREACTIVE

## 2020-07-13 LAB — CBC
HCT: 31.1 % — ABNORMAL LOW (ref 39.0–52.0)
Hemoglobin: 9.6 g/dL — ABNORMAL LOW (ref 13.0–17.0)
MCH: 28.5 pg (ref 26.0–34.0)
MCHC: 30.9 g/dL (ref 30.0–36.0)
MCV: 92.3 fL (ref 80.0–100.0)
Platelets: 64 10*3/uL — ABNORMAL LOW (ref 150–400)
RBC: 3.37 MIL/uL — ABNORMAL LOW (ref 4.22–5.81)
RDW: 13.9 % (ref 11.5–15.5)
WBC: 7.3 10*3/uL (ref 4.0–10.5)
nRBC: 0.8 % — ABNORMAL HIGH (ref 0.0–0.2)

## 2020-07-13 LAB — HEPATITIS B SURFACE ANTIGEN: Hepatitis B Surface Ag: NONREACTIVE

## 2020-07-13 LAB — HEPARIN LEVEL (UNFRACTIONATED): Heparin Unfractionated: 0.97 IU/mL — ABNORMAL HIGH (ref 0.30–0.70)

## 2020-07-13 LAB — GLUCOSE, CAPILLARY
Glucose-Capillary: 165 mg/dL — ABNORMAL HIGH (ref 70–99)
Glucose-Capillary: 166 mg/dL — ABNORMAL HIGH (ref 70–99)
Glucose-Capillary: 190 mg/dL — ABNORMAL HIGH (ref 70–99)
Glucose-Capillary: 215 mg/dL — ABNORMAL HIGH (ref 70–99)
Glucose-Capillary: 237 mg/dL — ABNORMAL HIGH (ref 70–99)
Glucose-Capillary: 259 mg/dL — ABNORMAL HIGH (ref 70–99)
Glucose-Capillary: 271 mg/dL — ABNORMAL HIGH (ref 70–99)
Glucose-Capillary: 290 mg/dL — ABNORMAL HIGH (ref 70–99)
Glucose-Capillary: 335 mg/dL — ABNORMAL HIGH (ref 70–99)
Glucose-Capillary: 398 mg/dL — ABNORMAL HIGH (ref 70–99)

## 2020-07-13 LAB — MAGNESIUM
Magnesium: 3.3 mg/dL — ABNORMAL HIGH (ref 1.7–2.4)
Magnesium: 2.8 mg/dL — ABNORMAL HIGH (ref 1.7–2.4)

## 2020-07-13 LAB — APTT
aPTT: 54 seconds — ABNORMAL HIGH (ref 24–36)
aPTT: 58 seconds — ABNORMAL HIGH (ref 24–36)
aPTT: 56 seconds — ABNORMAL HIGH (ref 24–36)

## 2020-07-13 LAB — TRIGLYCERIDES: Triglycerides: 208 mg/dL — ABNORMAL HIGH (ref <150)

## 2020-07-13 LAB — PHOSPHORUS
Phosphorus: 8.8 mg/dL — ABNORMAL HIGH (ref 2.5–4.6)
Phosphorus: 7.2 mg/dL — ABNORMAL HIGH (ref 2.5–4.6)

## 2020-07-13 LAB — HEPATITIS B CORE ANTIBODY, TOTAL: Hep B Core Total Ab: NONREACTIVE

## 2020-07-13 MED ORDER — HEPARIN SODIUM (PORCINE) 1000 UNIT/ML IJ SOLN
INTRAMUSCULAR | Status: AC
  Administered 2020-07-13: 1000 [IU]
  Filled 2020-07-13: qty 4

## 2020-07-13 MED ORDER — INSULIN REGULAR(HUMAN) IN NACL 100-0.9 UT/100ML-% IV SOLN
INTRAVENOUS | Status: DC
  Administered 2020-07-13: 11.5 [IU]/h via INTRAVENOUS
  Filled 2020-07-13: qty 100

## 2020-07-13 MED ORDER — LIDOCAINE HCL (PF) 1 % IJ SOLN
5.0000 mL | INTRAMUSCULAR | Status: DC | PRN
  Filled 2020-07-13: qty 5

## 2020-07-13 MED ORDER — METOPROLOL TARTRATE 5 MG/5ML IV SOLN
INTRAVENOUS | Status: AC
  Administered 2020-07-13: 5 mg via INTRAVENOUS
  Filled 2020-07-13: qty 5

## 2020-07-13 MED ORDER — HYDRALAZINE HCL 25 MG PO TABS
25.0000 mg | ORAL_TABLET | Freq: Four times a day (QID) | ORAL | Status: DC | PRN
  Administered 2020-07-14: 25 mg via ORAL
  Filled 2020-07-13: qty 1

## 2020-07-13 MED ORDER — METOPROLOL TARTRATE 25 MG PO TABS: 25.0000 mg | ORAL_TABLET | Freq: Four times a day (QID) | ORAL | Status: DC

## 2020-07-13 MED ORDER — DEXTROSE-NACL 5-0.45 % IV SOLN: INTRAVENOUS | Status: DC

## 2020-07-13 MED ORDER — ALTEPLASE 2 MG IJ SOLR: 2.0000 mg | Freq: Once | INTRAMUSCULAR | Status: DC | PRN

## 2020-07-13 MED ORDER — ALBUMIN HUMAN 25 % IV SOLN
25.0000 g | Freq: Once | INTRAVENOUS | Status: AC
  Administered 2020-07-13: 25 g via INTRAVENOUS
  Filled 2020-07-13: qty 100

## 2020-07-13 MED ORDER — HEPARIN SODIUM (PORCINE) 5000 UNIT/ML IJ SOLN
INTRAMUSCULAR | Status: AC
  Filled 2020-07-13: qty 1

## 2020-07-13 MED ORDER — METOPROLOL TARTRATE 25 MG PO TABS
25.0000 mg | ORAL_TABLET | Freq: Four times a day (QID) | ORAL | Status: DC
  Administered 2020-07-13 – 2020-07-14 (×3): 25 mg via ORAL
  Filled 2020-07-13 (×4): qty 1

## 2020-07-13 MED ORDER — AMIODARONE IV BOLUS ONLY 150 MG/100ML
150.0000 mg | Freq: Once | INTRAVENOUS | Status: AC
  Administered 2020-07-13: 150 mg via INTRAVENOUS
  Filled 2020-07-13: qty 100

## 2020-07-13 MED ORDER — SODIUM CHLORIDE 0.9 % IV SOLN: 100.0000 mL | INTRAVENOUS | Status: DC | PRN

## 2020-07-13 MED ORDER — PHENYLEPHRINE HCL-NACL 10-0.9 MG/250ML-% IV SOLN
INTRAVENOUS | Status: AC
  Administered 2020-07-13: 50 ug/min via INTRAVENOUS
  Filled 2020-07-13: qty 250

## 2020-07-13 MED ORDER — HEPARIN SODIUM (PORCINE) 1000 UNIT/ML DIALYSIS: 1000.0000 [IU] | INTRAMUSCULAR | Status: DC | PRN

## 2020-07-13 MED ORDER — SODIUM CHLORIDE 0.9 % IV SOLN: INTRAVENOUS | Status: DC

## 2020-07-13 MED ORDER — CHLORHEXIDINE GLUCONATE CLOTH 2 % EX PADS
6.0000 | MEDICATED_PAD | Freq: Every day | CUTANEOUS | Status: DC
  Administered 2020-07-13 – 2020-07-15 (×3): 6 via TOPICAL

## 2020-07-13 MED ORDER — BISACODYL 10 MG RE SUPP
10.0000 mg | Freq: Once | RECTAL | Status: AC
  Administered 2020-07-13: 10 mg via RECTAL
  Filled 2020-07-13: qty 1

## 2020-07-13 MED ORDER — LIDOCAINE-PRILOCAINE 2.5-2.5 % EX CREA: 1.0000 "application " | TOPICAL_CREAM | CUTANEOUS | Status: DC | PRN

## 2020-07-13 MED ORDER — AMIODARONE HCL 200 MG PO TABS
400.0000 mg | ORAL_TABLET | Freq: Three times a day (TID) | ORAL | Status: DC
  Administered 2020-07-13 – 2020-07-15 (×8): 400 mg via ORAL
  Filled 2020-07-13 (×8): qty 2

## 2020-07-13 MED ORDER — PHENYLEPHRINE HCL-NACL 10-0.9 MG/250ML-% IV SOLN
0.0000 ug/min | INTRAVENOUS | Status: DC
  Administered 2020-07-14: 20 ug/min via INTRAVENOUS
  Administered 2020-07-14: 250 ug/min via INTRAVENOUS
  Administered 2020-07-14: 400 ug/min via INTRAVENOUS
  Filled 2020-07-13 (×3): qty 250

## 2020-07-13 MED ORDER — INSULIN GLARGINE 100 UNIT/ML ~~LOC~~ SOLN
10.0000 [IU] | Freq: Every day | SUBCUTANEOUS | Status: DC
  Administered 2020-07-13: 10 [IU] via SUBCUTANEOUS
  Filled 2020-07-13 (×2): qty 0.1

## 2020-07-13 MED ORDER — PENTAFLUOROPROP-TETRAFLUOROETH EX AERO: 1.0000 "application " | INHALATION_SPRAY | CUTANEOUS | Status: DC | PRN

## 2020-07-13 MED ORDER — INSULIN ASPART 100 UNIT/ML ~~LOC~~ SOLN
0.0000 [IU] | SUBCUTANEOUS | Status: DC
  Administered 2020-07-13: 2 [IU] via SUBCUTANEOUS
  Administered 2020-07-14 (×4): 3 [IU] via SUBCUTANEOUS
  Administered 2020-07-14: 2 [IU] via SUBCUTANEOUS
  Administered 2020-07-14: 3 [IU] via SUBCUTANEOUS

## 2020-07-13 MED ORDER — METOPROLOL TARTRATE 5 MG/5ML IV SOLN: 5.0000 mg | Freq: Once | INTRAVENOUS | Status: AC

## 2020-07-13 MED ORDER — DEXTROSE 50 % IV SOLN: 0.0000 mL | INTRAVENOUS | Status: DC | PRN

## 2020-07-13 MED ORDER — INSULIN DETEMIR 100 UNIT/ML ~~LOC~~ SOLN
30.0000 [IU] | Freq: Two times a day (BID) | SUBCUTANEOUS | Status: DC
  Administered 2020-07-13: 30 [IU] via SUBCUTANEOUS
  Filled 2020-07-13 (×2): qty 0.3

## 2020-07-13 NOTE — Progress Notes (Signed)
eLink Physician-Brief Progress Note Patient Name: Gregory [name] DOB: October 30, 1951 MRN: [id number]   Date of Service  07/13/2020  HPI/Events of Note  RN asking if Heparin infusion is to be resumed.  eICU Interventions  Resume  Heparin infusion without a bolus.        Thomasene Lot Donta Mcinroy 07/13/2020, 2:47 AM

## 2020-07-13 NOTE — Progress Notes (Signed)
eLink Physician-Brief Progress Note Patient Name: [name] DOB: [birth year] MRN: [id number]   Date of Service  07/13/2020  HPI/Events of Note  Notified of SBP 180s to 190s despite clonidine and increased sedation Also glucose now 166 on insulin drip, also on TF at 45 and D5 1/2 NS at 75 Creatinine 5.58  eICU Interventions   Start Lantus at 10 for now given renal insufficiency to overlap with insulin drip  Hold D5 1/2 NS as tolerating TF  Start sliding scale insulin once off insulin drip  Continue to monitor glucose and assess if additional insulin needed     Intervention Category Major Interventions: Hyperglycemia - active titration of insulin therapy;Hypertension - evaluation and management  Darl Pikes 07/13/2020, 9:14 PM

## 2020-07-13 NOTE — Progress Notes (Signed)
ANTICOAGULATION CONSULT NOTE - Follow Up Consult  Pharmacy Consult for Heparin Indication: BL DVT  No Known Allergies  Patient Measurements: Height: 5\' 7"  (170.2 cm) Weight: 97.7 kg (215 lb 6.2 oz) IBW/kg (Calculated) : 66.1 Heparin Dosing Weight: 88kg  Vital Signs: Temp: 97.9 F (36.6 C) (08/06 2313) Temp Source: Oral (08/06 2313) BP: 112/64 (08/07 0000) Pulse Rate: 73 (08/07 0000)  Labs: Recent Labs    07/10/20 2156 07/10/20 2219 07/11/20 0330 07/11/20 1202 07/11/20 2103 07/12/20 0432 07/12/20 0432 07/12/20 0847 07/12/20 1208 07/12/20 1516 07/12/20 1819 07/12/20 1822  HGB 12.7*   < > 12.4*   < >  --  11.8*   < > 9.2*  --   --  10.2*  --   HCT 40.1   < > 39.6   < >  --  36.8*  --  27.0*  --   --  32.9*  --   PLT 94*   < > 113*  --   --  77*  --   --   --   --  65*  --   APTT >200*  --   --    < > 125* 121*  --   --   --  105*  --   --   HEPARINUNFRC >2.20*  --   --   --   --  1.40*  --   --   --   --   --   --   CREATININE 2.96*   < > 2.64*  --   --  4.56*  --   --  4.72*  --   --  5.15*   < > = values in this interval not displayed.    Estimated Creatinine Clearance: 15.3 mL/min (A) (by C-G formula based on SCr of 5.15 mg/dL (H)).    Assessment: Anticoag: Eliquis for acute bilateral DVT (received 3 doses, LD 8/3 AM), d-dimer >20 >> Heparin since NPO/AMS  Heparin has been off since ~1800 due to bleeding when suctioning (~30-5ml each time suctioned on evening shift). Dr. 45m is ok with restarting heparin with no bolus.  Goal of Therapy:  aPTT 66-102 seconds Monitor platelets by anticoagulation protocol: Yes   Plan:  Restart heparin at 650 units/hr Will recheck PTT in 8 hours and f/u bleeding closely  Warrick Parisian, PharmD, BCPS Please see amion for complete clinical pharmacist phone list 07/13/2020, 2:10 AM

## 2020-07-13 NOTE — Progress Notes (Signed)
ANTICOAGULATION CONSULT NOTE  Pharmacy Consult:  Heparin Indication: DVT  No Known Allergies  Patient Measurements: Height: 5\' 7"  (170.2 cm) Weight: 101 kg (222 lb 10.6 oz) IBW/kg (Calculated) : 66.1 Heparin Dosing Weight: 88 kg  Vital Signs: Temp: 98 F (36.7 C) (08/07 1231) Temp Source: Axillary (08/07 1231) BP: 120/65 (08/07 1245) Pulse Rate: 108 (08/07 1245)  Labs: Recent Labs    07/10/20 2156 07/10/20 2219 07/12/20 0432 07/12/20 0432 07/12/20 0847 07/12/20 1208 07/12/20 1516 07/12/20 1819 07/12/20 1819 07/12/20 1822 07/13/20 0248 07/13/20 0250 07/13/20 1119 07/13/20 1404  HGB 12.7*   < > 11.8*  --    < >  --   --  10.2*   < >  --  9.2* 9.6*  --   --   HCT 40.1   < > 36.8*  --    < >  --   --  32.9*  --   --  27.0* 31.1*  --   --   PLT 94*   < > 77*  --   --   --   --  65*  --   --   --  64*  --   --   APTT >200*   < > 121*   < >  --   --  105*  --   --   --   --   --  54* 58*  HEPARINUNFRC >2.20*  --  1.40*  --   --   --   --   --   --   --   --   --  0.97*  --   CREATININE 2.96*   < > 4.56*  --   --  4.72*  --   --   --  5.15*  --  5.58*  --   --    < > = values in this interval not displayed.    Estimated Creatinine Clearance: 14.4 mL/min (A) (by C-G formula based on SCr of 5.58 mg/dL (H)).   Medical History: History reviewed. No pertinent past medical history.   Assessment: [name] presented with hypoxia and confusion in setting of Covid-19.  Patient transferred to the ICU due to respiratory distress.  He has been on Eliquis for acute bilateral DVT, last dose on 07/09/20 AM.  Pharmacy consulted to transition patient to IV heparin due to NPO status/AMS.  Patient has worsening AKI.  D-dimer elevated.    APTT and HL are not yet correlating - using aPTT for now.  APTT sub-therapeutic (lab drawn appropriately from A-line and heparin is infusing through the central line).  Bleeding from mouth is improving per RN.  Platelet count continues to trend down.  Goal  of Therapy:  Heparin level 0.3-0.7 units/ml aPTT 66 - 102 seconds Monitor platelets by anticoagulation protocol: Yes   Plan:  Increase heparin gtt to 750 units/hr Check 6 hr aPTT Daily heparin level, aPTT and CBC  Gregory [name] D. 09/08/20, PharmD, BCPS, BCCCP 07/13/2020, 2:52 PM

## 2020-07-13 NOTE — Progress Notes (Signed)
Subjective:  Hemodynamically stable-  UOP is actually picking up but K is 5.7 and BUN 133 so will plan for HD treatment today   Objective Vital signs in last 24 hours: Vitals:   07/13/20 0715 07/13/20 0730 07/13/20 0745 07/13/20 0800  BP: 126/62 112/61 125/65 (!) 164/64  Pulse: 73 74 70 72  Resp: '12 13 12 ' (!) 25  Temp:      TempSrc:      SpO2: 100% 100% 100% 95%  Weight:      Height:       Weight change: 2.4 kg  Intake/Output Summary (Last 24 hours) at 07/13/2020 0849 Last data filed at 07/13/2020 0747 Gross per 24 hour  Intake 1682.84 ml  Output 895 ml  Net 787.84 ml    Assessment/Plan: 69 year old BM- appears to have CKD at baseline but unclear to what degree- initial urine with protein-  3 grams - normal sized kidneys c/w medical renal disease-  At best crt has been 2.6.  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  Oliguric and BUN of over 100-  Issues with hyperkalemia.  Family desires full scope of care.  I had discussed with Dr. Tacy Learn as patient will need hemodialysis access- placed yesterday -  Even though his UOP is picking up-  He has continued to have issued with hyperkalemia and BUN now of 133-  Will plan for IHD to be done in room today  2. Hyperkalemia-  Start scheduled lokelma-   HD today-  Presume that k will be better after HD so will hold lokelma after and follow 3. Hypertension/volume  - I's and O's actually negative overall but positive the last couple of days with oliguria.  Does not seem to be too volume overloaded-     Min UF with HD given fio2 of 60 4. Anemia  - had not been issue-  Supportive care  5. COVID PNA-  Vent/steriods- per CCM    Thien Berka A Josehua Hammar    Labs: Basic Metabolic  Panel: Recent Labs  Lab 07/12/20 1208 07/12/20 1208 07/12/20 1822 07/13/20 0248 07/13/20 0250  NA 144   < > 144 142 143  K 5.5*   < > 5.7* 5.5* 5.7*  CL 112*  --  113*  --  109  CO2 20*  --  21*  --  20*  GLUCOSE 241*  --  196*  --  346*  BUN 114*  --  121*  --  133*  CREATININE 4.72*  --  5.15*  --  5.58*  CALCIUM 8.1*  --  7.6*  --  7.7*  PHOS 7.3*  --  8.3*  --  8.8*   < > = values in this interval not displayed.   Liver Function Tests: Recent Labs  Lab 07/11/20 0330 07/12/20 0432 07/13/20 0250  AST 51* 43* 43*  ALT '31 24 19  ' ALKPHOS 131* 105 89  BILITOT 0.4 0.7 0.3  PROT 5.1* 4.9* 4.6*  ALBUMIN 2.1* 1.9* 1.8*   No results for input(s): LIPASE, AMYLASE in the last 168 hours. No results for input(s): AMMONIA in the last 168 hours. CBC: Recent Labs  Lab 07/07/20 0726 07/07/20 0726 07/08/20 0419 07/09/20 0747  07/10/20 2156 07/10/20 2219 07/11/20 0330 07/11/20 1202 07/12/20 0432 07/12/20 0847 07/12/20 1819 07/13/20 0248 07/13/20 0250  WBC 14.4*   < > 15.9*   < > 4.8   < > 5.4  --  7.3  --  6.5  --  7.3  NEUTROABS 10.9*  --  13.7*  --   --   --   --   --   --   --   --   --   --   HGB 12.2*   < > 12.3*   < > 12.7*   < > 12.4*   < > 11.8*   < > 10.2* 9.2* 9.6*  HCT 37.5*   < > 37.6*   < > 40.1   < > 39.6   < > 36.8*   < > 32.9* 27.0* 31.1*  MCV 87.8   < > 89.1   < > 90.3  --  90.6  --  91.8  --  91.6  --  92.3  PLT 132*   < > 126*   < > 94*   < > 113*  --  77*  --  65*  --  64*   < > = values in this interval not displayed.   Cardiac Enzymes: No results for input(s): CKTOTAL, CKMB, CKMBINDEX, TROPONINI in the last 168 hours. CBG: Recent Labs  Lab 07/12/20 1805 07/12/20 1944 07/12/20 2313 07/13/20 0338 07/13/20 0742  GLUCAP 168* 194* 239* 335* 398*    Iron Studies: No results for input(s): IRON, TIBC, TRANSFERRIN, FERRITIN in the last 72 hours. Studies/Results: DG CHEST PORT 1 VIEW  Result Date: 07/12/2020 CLINICAL DATA:  Mental status change.  EXAM: PORTABLE CHEST 1 VIEW COMPARISON:  July 11, 2020 FINDINGS: There are persistent diffuse bilateral hazy airspace opacities which have not significantly changed since the prior study. The right-sided central venous catheter is unchanged in positioning. There is a new left-sided dialysis catheter that courses across the patient's midline and likely terminates in the right internal jugular vein. The enteric tube terminates below the left hemidiaphragm. The endotracheal tube terminates above the carina. There is no pneumothorax. There may be small bilateral pleural effusions. IMPRESSION: 1. New left-sided dialysis catheter that courses across the patient's midline and likely terminates in the right internal jugular vein. Repositioning is recommended. 2. Remaining lines and tubes as above. 3. No significant interval change in appearance of the lungs bilaterally. These results will be called to the ordering clinician or representative by the Radiologist Assistant, and communication documented in the PACS or [employer]. Electronically Signed   By: Constance Holster M.D.   On: 07/12/2020 19:24   Medications: Infusions: . sodium chloride Stopped (07/12/20 0357)  . amiodarone 30 mg/hr (07/13/20 0700)  . feeding supplement (NEPRO CARB STEADY) 1,000 mL (07/12/20 1807)  . fentaNYL infusion INTRAVENOUS 50 mcg/hr (07/13/20 0801)  . heparin 650 Units/hr (07/13/20 0700)  . propofol (DIPRIVAN) infusion 15 mcg/kg/min (07/13/20 6468)    Scheduled Medications: . chlorhexidine gluconate (MEDLINE KIT)  15 mL Mouth Rinse BID  . Chlorhexidine Gluconate Cloth  6 each Topical Q0600  . Chlorhexidine Gluconate Cloth  6 each Topical Q0600  . docusate  100 mg Per Tube BID  . feeding supplement (PROSource TF)  45 mL Per Tube TID  . free water  250 mL Per Tube Q4H  . insulin aspart  0-20 Units Subcutaneous Q4H  . insulin detemir  30 Units Subcutaneous BID  . mouth rinse  15 mL Mouth  Rinse 10 times per day  .  multivitamin with minerals  1 tablet Per Tube Daily  . pantoprazole sodium  40 mg Per Tube Daily  . polyethylene glycol  17 g Per Tube Daily  . QUEtiapine  50 mg Per Tube BID  . sevelamer carbonate  1,600 mg Per Tube TID  . sodium chloride flush  10-40 mL Intracatheter Q12H  . sodium chloride flush  3 mL Intravenous Q12H  . sodium zirconium cyclosilicate  10 g Oral TID    have reviewed scheduled and prn medications.  Physical Exam: Visualized thru the glass- calm on vent -  discussed with nursing-  Some pitting edema-    07/13/2020,8:49 AM  LOS: 10 days

## 2020-07-13 NOTE — Progress Notes (Signed)
eLink Physician-Brief Progress Note Patient Name: Gregory [name] DOB: 1951-06-27 MRN: [id number]   Date of Service  07/13/2020  HPI/Events of Note  Renal failure requiring dialysis, hyperphosphatemia.  eICU Interventions  Dialysis catheter is already in place pending dialysis, Pt is on Renvela for hyperphosphatemia.        Thomasene Lot Keigan Tafoya 07/13/2020, 5:48 AM

## 2020-07-13 NOTE — Progress Notes (Signed)
   Palliative Medicine Inpatient Follow Up Note  HPI: 69 y.o. 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  Patient required intubation early AM 8/5 d/t respiratory failure. PMT consulted to discuss GOC.  Today's Discussion (07/13/2020): Chart reviewed. Spoke to bedside staff. They shared that the patient gets hypertensive with any form of stimulation. He had some bleeding with suctioning, heparin was cautiously restarted without bolus dosing.   I called patients daughter, Gregory [name] to offer support. We talked about her fathers present condition. She requested more information regarding possible outcomes. I was able to share with her that it is unclear right now what the future outcome may be. I brought to her attention in instances where patients cannot be safely extubated/manage secretions that there are considerations of tracheostomy's and long term ventilatory support. We discussed infection risk and overall burden in such circumstances. We discussed artificial nutrition in terms of g-tube placement if [name] were unable to safely eat and drink again. We dicussed LTACH placements and what those often look like. I shared that it is important when making these decisions to consider what her father would want. Gregory [name] shares that she had not thought about the bigger picture. She states that she remains hopeful for improvements. She shares that yesterday was her fathers birthday and he received many calls. I told her that if able it would be of benefit for her to see him via E-Link to get a better impression of hs physical state.   I shared with Gregory [name] that I would ask the critical care team to provide a more comprehensive medical update to aid in her ongoing decision making.   Gregory [name] requested to know if [name] could be covid tested again or if she could get a better idea in terms of when isolation would be removed. These  questions were relayed to the primary team.   Discussed the importance of continued conversation with family and their  medical providers regarding overall plan of care and treatment options, ensuring decisions are within the context of the patients values and GOCs.  Questions and concerns addressed   The Palliative team will remain involved to help support Gregory [name] and his family during this difficult time.  Primary Decision Maker Gregory [name] (Daughter) [phone]  SUMMARY OF RECOMMENDATIONS   - full code/full scope - PMT will follow for support and decision making  Time Spent: 35 Greater than 50% of the time was spent in counseling and coordination of care ______________________________________________________________________________________ [name] [facility] Palliative Medicine Team Team Cell Phone: [phone] Please utilize secure chat with additional questions, if there is no response within 30 minutes please call the above phone number  Palliative Medicine Team providers are available by phone from 7am to 7pm daily and can be reached through the team cell phone.  Should this patient require assistance outside of these hours, please call the patient's attending physician.

## 2020-07-13 NOTE — Progress Notes (Signed)
ANTICOAGULATION CONSULT NOTE  Pharmacy Consult:  Heparin Indication: DVT  No Known Allergies  Patient Measurements: Height: 5\' 7"  (170.2 cm) Weight: 101 kg (222 lb 10.6 oz) IBW/kg (Calculated) : 66.1 Heparin Dosing Weight: 88 kg  Vital Signs: Temp: 98.2 F (36.8 C) (08/07 2000) Temp Source: Oral (08/07 2000) BP: 152/59 (08/07 2100) Pulse Rate: 73 (08/07 2101)  Labs: Recent Labs    07/12/20 0432 07/12/20 0847 07/12/20 1208 07/12/20 1516 07/12/20 1819 07/12/20 1819 07/12/20 1822 07/13/20 0248 07/13/20 0250 07/13/20 1119 07/13/20 1404 07/13/20 2045  HGB 11.8*   < >  --   --  10.2*   < >  --  9.2* 9.6*  --   --   --   HCT 36.8*   < >  --   --  32.9*  --   --  27.0* 31.1*  --   --   --   PLT 77*  --   --   --  65*  --   --   --  64*  --   --   --   APTT 121*  --   --    < >  --   --   --   --   --  54* 58* 56*  HEPARINUNFRC 1.40*  --   --   --   --   --   --   --   --  0.97*  --   --   CREATININE 4.56*  --  4.72*  --   --   --  5.15*  --  5.58*  --   --   --    < > = values in this interval not displayed.    Estimated Creatinine Clearance: 14.4 mL/min (A) (by C-G formula based on SCr of 5.58 mg/dL (H)).   Medical History: History reviewed. No pertinent past medical history.   Assessment: 53 YOM presented with hypoxia and confusion in setting of Covid-19.  Patient transferred to the ICU due to respiratory distress.  He has been on Eliquis for acute bilateral DVT, last dose on 07/09/20 AM.  Pharmacy consulted to transition patient to IV heparin due to NPO status/AMS.  Patient has worsening AKI.  D-dimer elevated.    APTT and HL are not yet correlating - using aPTT for now.  APTT sub-therapeutic (lab drawn appropriately from A-line and heparin is infusing through the central line).  Bleeding from mouth is improving per RN.  Platelet count continues to trend down.  APTT this evening is still below.  No issues with IV infusion.  Goal of Therapy:  Heparin level 0.3-0.7  units/ml aPTT 66 - 102 seconds Monitor platelets by anticoagulation protocol: Yes   Plan:  Increase heparin gtt to 850 units/hr Check 6 hr aPTT w/ AM labs. Daily heparin level, aPTT and CBC  09/08/20, Reece Leader, [facility] Clinical Pharmacist  07/13/2020 10:57 PM   [facility] pharmacy phone numbers are listed on [email or web address]

## 2020-07-13 NOTE — Progress Notes (Signed)
Notified E-Link RN pt systolic BP > 180 after increasing sedation and administering PRN clonidine. Pending new orders. Will continue to monitor.

## 2020-07-13 NOTE — Progress Notes (Signed)
NAME:  Gregory [name], MRN:  [id number], DOB:  [birth year], LOS: 10 ADMISSION DATE:  06/06/2020, CONSULTATION DATE:  07/09/2020 REFERRING MD:  Elgergawy, CHIEF COMPLAINT:  SOB, hypoxemia   Brief History   69 year old male with history of diabetes mellitus, hypertension, gout and CVA without focal deficits presented on 7/28 with hypoxia on non-rebreather. Code sepsis initiated s/p fluid resuscitation. Admitted to ICU for acute hypoxic respiratory failure 2/2 COVID pneumonia.   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 Asthma   Significant Hospital Events   7/29 admit to ICU 7/31 transfer to floor 8/3 transferred to ICU for worsening hypoxia  8/5 intubated  Consults:  PCCM  Procedures:  None  Significant Diagnostic Tests:  CXR 7/28> Diffuse bilateral airspace disease, pneumonia v edema CXR 8/4 > Diffuse bilateral airspace disease with air bronchograms, progression since prior study   Micro Data:  COVID positive 7/28 Blood cx 7/28 > negative  Urine cx 7/28> negative  MRSA 7/29 > negative  Antimicrobials:  Vancomycin 7/28 Cefepime 7/28 Flagyl 7/28 Remdesivir 7/28> 8/2 Tovilizumab 7/28  Interim history/subjective:  Overnight, no acute events reported. Patient remains sedated and mechanically ventilated. HD catheter p[laced yesterday. HD this AM with AFIb RVR and low Bps, phenyl started  Objective   Blood pressure 129/72, pulse 83, temperature 97.6 F (36.4 C), temperature source Axillary, resp. rate (!) 29, height 5\' 7"  (1.702 m), weight 101 kg, SpO2 96 %.    Vent Mode: PRVC FiO2 (%):  [50 %-60 %] 60 % Set Rate:  [28 bmp] 28 bmp Vt Set:  [420 mL] 420 mL PEEP:  [12 cmH20] 12 cmH20 Plateau Pressure:  [27 cmH20] 27 cmH20   Intake/Output Summary (Last 24 hours) at 07/13/2020 1132 Last data filed at 07/13/2020 1000 Gross per 24 hour  Intake 1831.97 ml  Output 895 ml  Net 936.97 ml   Filed Weights   07/12/20 0405  07/13/20 0500 07/13/20 1001  Weight: 97.7 kg 100.1 kg 101 kg    Examination: General: critically ill appearing elderly gentleman; heavily sedated and mechanically ventilated  HENT: [place]/AT, anicteric sclerae, PERRL, ET tube in place  Lungs: diffuse rales bilaterally, on full ventilator support with FiO2 50% and PEEP 12 Cardiovascular: RRR, no m/r/g Abdomen: nondistended, soft, nontender, normoactive bowel sounds Extremities: warm/dry, nonedematous  Neuro: heavily sedated, PERRL  Resolved Hospital Problem list    Assessment & Plan:  Acute hypoxic respiratory failure secondary to moderate ARDS in setting of COVID 19 pneumonia . Remains on full vent support. However, slightly improved from prior.  - Continue full ventilator support with goal O2 88-92%, Ppeak <30; wean as tolerated  - Currently does not meet proning parameters  - VAP per protocol   Afib RVR: Flips in AFIB and NSR. RVR present during HD this AM. -Amio bolus, continue amio oral load, avoid BB given hypotension  Acute toxic metabolic encephalopathy Currently on fentanyl and propofol gtt  - Goal to discontinue fentanyl gtt today, can give intermittent fentanyl bolus as needed  - Wean as tolerated for RASS goal -1 to -2  Acute on chronic renal insufficiency  Secondary hyperparathyroidism  Worsening renal function this AM with hyperkalemia, hyperphosphatemia and hypocalcemia. Elevated BUN/sCr and decreased urine output. Given one dose of bicarb.  - Trial HD, nay require CVVHD given low pressures  Hypotension: Multifactorial - sedation, Afib, blood in HD circuit (hypovolemia?) --MAP > 65, phenylephrine given Afib w/ RVR  Acute LLE DVT  Pulseless LLE: Able to Doppler LLE pulse AM 8/7 . - LLE arterial US, consider vascular consult pending results  - Heparin dosing per pharmacy   Hyperglycemia in setting of diabetes mellitus: Sugar 340s this AM Dexamethasone and steroids  - Increase levemir to 25u bid + SSI resistant  -  Consider addition of tube feed coverage -May need insulin gtt   Best practice:  Diet: tube feeds  Pain/Anxiety/Delirium protocol (if indicated): per protocol VAP protocol (if indicated): per protocol DVT prophylaxis: Heparin GI prophylaxis: Protonix Glucose control: Levemir + SSI Mobility: Bed rest  Code Status: FULL  Family Communication: will update daughter  Disposition: ICU  Labs    Critical care time: 47 minutes    Critical care time was exclusive of separately billable procedures and treating other patients.  Critical care was necessary to treat or prevent imminent or life-threatening deterioration.  Critical care was time spent personally by me on the following activities: development of treatment plan with patient and/or surrogate as well as nursing, discussions with consultants, evaluation of patient's response to treatment, examination of patient, obtaining history from patient or surrogate, ordering and performing treatments and interventions, ordering and review of laboratory studies, ordering and review of radiographic studies, pulse oximetry, re-evaluation of patient's condition and participation in multidisciplinary rounds.

## 2020-07-14 ENCOUNTER — Inpatient Hospital Stay (HOSPITAL_COMMUNITY): Payer: No Typology Code available for payment source

## 2020-07-14 DIAGNOSIS — R0989 Other specified symptoms and signs involving the circulatory and respiratory systems: Secondary | ICD-10-CM

## 2020-07-14 DIAGNOSIS — I482 Chronic atrial fibrillation, unspecified: Secondary | ICD-10-CM

## 2020-07-14 LAB — RENAL FUNCTION PANEL
Albumin: 1.9 g/dL — ABNORMAL LOW (ref 3.5–5.0)
Anion gap: 12 (ref 5–15)
BUN: 97 mg/dL — ABNORMAL HIGH (ref 8–23)
CO2: 22 mmol/L (ref 22–32)
Calcium: 7.4 mg/dL — ABNORMAL LOW (ref 8.9–10.3)
Chloride: 103 mmol/L (ref 98–111)
Creatinine, Ser: 4.78 mg/dL — ABNORMAL HIGH (ref 0.61–1.24)
GFR calc Af Amer: 13 mL/min — ABNORMAL LOW (ref >60)
GFR calc non Af Amer: 12 mL/min — ABNORMAL LOW (ref >60)
Glucose, Bld: 223 mg/dL — ABNORMAL HIGH (ref 70–99)
Phosphorus: 6.5 mg/dL — ABNORMAL HIGH (ref 2.5–4.6)
Potassium: 5.5 mmol/L — ABNORMAL HIGH (ref 3.5–5.1)
Sodium: 137 mmol/L (ref 135–145)

## 2020-07-14 LAB — POCT I-STAT 7, (LYTES, BLD GAS, ICA,H+H)
Acid-base deficit: 3 mmol/L — ABNORMAL HIGH (ref 0.0–2.0)
Acid-base deficit: 5 mmol/L — ABNORMAL HIGH (ref 0.0–2.0)
Acid-base deficit: 5 mmol/L — ABNORMAL HIGH (ref 0.0–2.0)
Bicarbonate: 26 mmol/L (ref 20.0–28.0)
Bicarbonate: 25.3 mmol/L (ref 20.0–28.0)
Bicarbonate: 23.4 mmol/L (ref 20.0–28.0)
Calcium, Ion: 0.98 mmol/L — ABNORMAL LOW (ref 1.15–1.40)
Calcium, Ion: 0.98 mmol/L — ABNORMAL LOW (ref 1.15–1.40)
Calcium, Ion: 0.95 mmol/L — ABNORMAL LOW (ref 1.15–1.40)
HCT: 25 % — ABNORMAL LOW (ref 39.0–52.0)
HCT: 27 % — ABNORMAL LOW (ref 39.0–52.0)
HCT: 26 % — ABNORMAL LOW (ref 39.0–52.0)
Hemoglobin: 8.5 g/dL — ABNORMAL LOW (ref 13.0–17.0)
Hemoglobin: 9.2 g/dL — ABNORMAL LOW (ref 13.0–17.0)
Hemoglobin: 8.8 g/dL — ABNORMAL LOW (ref 13.0–17.0)
O2 Saturation: 73 %
O2 Saturation: 73 %
O2 Saturation: 86 %
Patient temperature: 101.5
Potassium: 5.3 mmol/L — ABNORMAL HIGH (ref 3.5–5.1)
Potassium: 5.9 mmol/L — ABNORMAL HIGH (ref 3.5–5.1)
Potassium: 5.6 mmol/L — ABNORMAL HIGH (ref 3.5–5.1)
Sodium: 135 mmol/L (ref 135–145)
Sodium: 136 mmol/L (ref 135–145)
Sodium: 136 mmol/L (ref 135–145)
TCO2: 28 mmol/L (ref 22–32)
TCO2: 28 mmol/L (ref 22–32)
TCO2: 25 mmol/L (ref 22–32)
pCO2 arterial: 68 mmHg (ref 32.0–48.0)
pCO2 arterial: 87.8 mmHg (ref 32.0–48.0)
pCO2 arterial: 57.5 mmHg — ABNORMAL HIGH (ref 32.0–48.0)
pH, Arterial: 7.191 — CL (ref 7.350–7.450)
pH, Arterial: 7.079 — CL (ref 7.350–7.450)
pH, Arterial: 7.217 — ABNORMAL LOW (ref 7.350–7.450)
pO2, Arterial: 48 mmHg — ABNORMAL LOW (ref 83.0–108.0)
pO2, Arterial: 60 mmHg — ABNORMAL LOW (ref 83.0–108.0)
pO2, Arterial: 63 mmHg — ABNORMAL LOW (ref 83.0–108.0)

## 2020-07-14 LAB — CBC
HCT: 30.3 % — ABNORMAL LOW (ref 39.0–52.0)
HCT: 30.3 % — ABNORMAL LOW (ref 39.0–52.0)
Hemoglobin: 9.2 g/dL — ABNORMAL LOW (ref 13.0–17.0)
Hemoglobin: 9.4 g/dL — ABNORMAL LOW (ref 13.0–17.0)
MCH: 28.1 pg (ref 26.0–34.0)
MCH: 29.5 pg (ref 26.0–34.0)
MCHC: 30.4 g/dL (ref 30.0–36.0)
MCHC: 31 g/dL (ref 30.0–36.0)
MCV: 92.7 fL (ref 80.0–100.0)
MCV: 95 fL (ref 80.0–100.0)
Platelets: 62 10*3/uL — ABNORMAL LOW (ref 150–400)
Platelets: 82 10*3/uL — ABNORMAL LOW (ref 150–400)
RBC: 3.27 MIL/uL — ABNORMAL LOW (ref 4.22–5.81)
RBC: 3.19 MIL/uL — ABNORMAL LOW (ref 4.22–5.81)
RDW: 14.1 % (ref 11.5–15.5)
RDW: 14 % (ref 11.5–15.5)
WBC: 6.3 10*3/uL (ref 4.0–10.5)
WBC: 11.4 10*3/uL — ABNORMAL HIGH (ref 4.0–10.5)
nRBC: 2.1 % — ABNORMAL HIGH (ref 0.0–0.2)
nRBC: 2.3 % — ABNORMAL HIGH (ref 0.0–0.2)

## 2020-07-14 LAB — APTT
aPTT: 59 seconds — ABNORMAL HIGH (ref 24–36)
aPTT: 47 seconds — ABNORMAL HIGH (ref 24–36)
aPTT: 88 seconds — ABNORMAL HIGH (ref 24–36)

## 2020-07-14 LAB — GLUCOSE, CAPILLARY
Glucose-Capillary: 190 mg/dL — ABNORMAL HIGH (ref 70–99)
Glucose-Capillary: 205 mg/dL — ABNORMAL HIGH (ref 70–99)
Glucose-Capillary: 214 mg/dL — ABNORMAL HIGH (ref 70–99)
Glucose-Capillary: 220 mg/dL — ABNORMAL HIGH (ref 70–99)
Glucose-Capillary: 208 mg/dL — ABNORMAL HIGH (ref 70–99)
Glucose-Capillary: 201 mg/dL — ABNORMAL HIGH (ref 70–99)

## 2020-07-14 LAB — HEPARIN LEVEL (UNFRACTIONATED): Heparin Unfractionated: 0.86 IU/mL — ABNORMAL HIGH (ref 0.30–0.70)

## 2020-07-14 LAB — COMPREHENSIVE METABOLIC PANEL
ALT: 17 U/L (ref 0–44)
AST: 54 U/L — ABNORMAL HIGH (ref 15–41)
Albumin: 2.1 g/dL — ABNORMAL LOW (ref 3.5–5.0)
Alkaline Phosphatase: 85 U/L (ref 38–126)
Anion gap: 13 (ref 5–15)
BUN: 108 mg/dL — ABNORMAL HIGH (ref 8–23)
CO2: 23 mmol/L (ref 22–32)
Calcium: 7.9 mg/dL — ABNORMAL LOW (ref 8.9–10.3)
Chloride: 105 mmol/L (ref 98–111)
Creatinine, Ser: 5.17 mg/dL — ABNORMAL HIGH (ref 0.61–1.24)
GFR calc Af Amer: 12 mL/min — ABNORMAL LOW (ref >60)
GFR calc non Af Amer: 11 mL/min — ABNORMAL LOW (ref >60)
Glucose, Bld: 218 mg/dL — ABNORMAL HIGH (ref 70–99)
Potassium: 5.2 mmol/L — ABNORMAL HIGH (ref 3.5–5.1)
Sodium: 141 mmol/L (ref 135–145)
Total Bilirubin: 0.6 mg/dL (ref 0.3–1.2)
Total Protein: 4.8 g/dL — ABNORMAL LOW (ref 6.5–8.1)

## 2020-07-14 LAB — TRIGLYCERIDES: Triglycerides: 169 mg/dL — ABNORMAL HIGH (ref <150)

## 2020-07-14 MED ORDER — INSULIN ASPART 100 UNIT/ML ~~LOC~~ SOLN
0.0000 [IU] | SUBCUTANEOUS | Status: DC
  Administered 2020-07-15: 3 [IU] via SUBCUTANEOUS
  Administered 2020-07-15: 1 [IU] via SUBCUTANEOUS
  Administered 2020-07-15: 2 [IU] via SUBCUTANEOUS
  Administered 2020-07-15: 3 [IU] via SUBCUTANEOUS
  Administered 2020-07-15: 1 [IU] via SUBCUTANEOUS
  Administered 2020-07-15 (×2): 3 [IU] via SUBCUTANEOUS
  Filled 2020-07-14: qty 1

## 2020-07-14 MED ORDER — HEPARIN SODIUM (PORCINE) 1000 UNIT/ML DIALYSIS
1000.0000 [IU] | INTRAMUSCULAR | Status: DC | PRN
  Filled 2020-07-14: qty 6

## 2020-07-14 MED ORDER — PRISMASOL BGK 4/2.5 32-4-2.5 MEQ/L REPLACEMENT SOLN
Status: DC
  Filled 2020-07-14 (×8): qty 5000

## 2020-07-14 MED ORDER — VANCOMYCIN HCL IN DEXTROSE 1-5 GM/200ML-% IV SOLN
1000.0000 mg | INTRAVENOUS | Status: DC
  Administered 2020-07-15 – 2020-07-16 (×2): 1000 mg via INTRAVENOUS
  Filled 2020-07-14 (×2): qty 200

## 2020-07-14 MED ORDER — NOREPINEPHRINE 16 MG/250ML-% IV SOLN
0.0000 ug/min | INTRAVENOUS | Status: DC
  Administered 2020-07-14: 30 ug/min via INTRAVENOUS
  Administered 2020-07-14: 22 ug/min via INTRAVENOUS
  Administered 2020-07-15: 40 ug/min via INTRAVENOUS
  Administered 2020-07-15: 22 ug/min via INTRAVENOUS
  Administered 2020-07-16: 60 ug/min via INTRAVENOUS
  Administered 2020-07-16: 46 ug/min via INTRAVENOUS
  Administered 2020-07-16: 60 ug/min via INTRAVENOUS
  Filled 2020-07-14 (×7): qty 250

## 2020-07-14 MED ORDER — INSULIN DETEMIR 100 UNIT/ML ~~LOC~~ SOLN
30.0000 [IU] | Freq: Two times a day (BID) | SUBCUTANEOUS | Status: DC
  Administered 2020-07-14 – 2020-07-16 (×5): 30 [IU] via SUBCUTANEOUS
  Filled 2020-07-14 (×6): qty 0.3

## 2020-07-14 MED ORDER — VANCOMYCIN VARIABLE DOSE PER UNSTABLE RENAL FUNCTION (PHARMACIST DOSING): Status: DC

## 2020-07-14 MED ORDER — SODIUM CHLORIDE 0.9 % FOR CRRT: INTRAVENOUS_CENTRAL | Status: DC | PRN

## 2020-07-14 MED ORDER — VANCOMYCIN HCL 2000 MG/400ML IV SOLN
2000.0000 mg | Freq: Once | INTRAVENOUS | Status: AC
  Administered 2020-07-14: 2000 mg via INTRAVENOUS
  Filled 2020-07-14: qty 400

## 2020-07-14 MED ORDER — SODIUM CHLORIDE 0.9 % IV SOLN
1.0000 g | Freq: Once | INTRAVENOUS | Status: DC
  Filled 2020-07-14: qty 1

## 2020-07-14 MED ORDER — SODIUM CHLORIDE 0.9 % IV SOLN
2.0000 g | Freq: Once | INTRAVENOUS | Status: AC
  Administered 2020-07-14: 2 g via INTRAVENOUS
  Filled 2020-07-14: qty 2

## 2020-07-14 MED ORDER — PRISMASOL BGK 0/2.5 32-2.5 MEQ/L IV SOLN
INTRAVENOUS | Status: DC
  Filled 2020-07-14 (×22): qty 5000

## 2020-07-14 MED ORDER — SODIUM CHLORIDE 0.9 % IV SOLN
2.0000 g | Freq: Two times a day (BID) | INTRAVENOUS | Status: DC
  Administered 2020-07-14 – 2020-07-16 (×4): 2 g via INTRAVENOUS
  Filled 2020-07-14 (×4): qty 2

## 2020-07-14 MED ORDER — NOREPINEPHRINE 4 MG/250ML-% IV SOLN
0.0000 ug/min | INTRAVENOUS | Status: DC
  Administered 2020-07-14: 30 ug/min via INTRAVENOUS
  Filled 2020-07-14 (×2): qty 250

## 2020-07-14 MED ORDER — PHENYLEPHRINE CONCENTRATED 100MG/250ML (0.4 MG/ML) INFUSION SIMPLE
0.0000 ug/min | INTRAVENOUS | Status: DC
  Administered 2020-07-14: 100 ug/min via INTRAVENOUS
  Administered 2020-07-15: 280 ug/min via INTRAVENOUS
  Administered 2020-07-16 (×2): 400 ug/min via INTRAVENOUS
  Administered 2020-07-16: 230 ug/min via INTRAVENOUS
  Filled 2020-07-14 (×7): qty 250

## 2020-07-14 MED ORDER — PRISMASOL BGK 4/2.5 32-4-2.5 MEQ/L REPLACEMENT SOLN
Status: DC
  Filled 2020-07-14 (×9): qty 5000

## 2020-07-14 NOTE — Progress Notes (Signed)
Subjective:  Did HD yest- had a little low BP with HD but then suffered with high BP ?   UOP is actually picking up but K is 5.2 and BUN 108 today -  Fio2 is going up and BP is down again this AM- starting pressors  Objective Vital signs in last 24 hours: Vitals:   07/14/20 0630 07/14/20 0700 07/14/20 0730 07/14/20 0800  BP: (!) 132/59 (!) 117/54 (!) 109/56 (!) 92/51  Pulse: 82 80 79 77  Resp: (!) 25 (!) '21 19 20  ' Temp:    (!) 101.5 F (38.6 C)  TempSrc:    Axillary  SpO2: 90% (!) 88% (!) 89% (!) 88%  Weight:      Height:       Weight change: 0.9 kg  Intake/Output Summary (Last 24 hours) at 07/14/2020 9532 Last data filed at 07/14/2020 0800 Gross per 24 hour  Intake 2114.22 ml  Output 620 ml  Net 1494.22 ml    Assessment/Plan: 69 year old BM- appears to have CKD at baseline but unclear to what degree- initial urine with protein-  3 grams - normal sized kidneys c/w medical renal disease-  At best crt has been 2.6.  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  Oliguric and BUN of over 100-  Issues with hyperkalemia.  Family desires full scope of care.  did hemodialysis 8/7- numbers still looking not great today, this along with his decompensation allows me to think we need to start him on CRRT - discussed with primary and nursing  2. Hyperkalemia-  Started scheduled lokelma-   HD 8/7-  K 5.2 this AM-  Will correct with CRRT-  4 K pre and post but 2 k dialysate  3. Hypertension/volume  -      Min UF with HD due to low BP but Fio2 is going up-  Will try some UF with CRRT  4. Anemia  - had not been issue but dropping-  No meds yet-  Will give dose of  darbe today   5. COVID PNA-  Vent/steriods- per CCM    Kaijah Abts A  Kristina Bertone    Labs: Basic Metabolic Panel: Recent Labs  Lab 07/12/20 1822 07/13/20 0248 07/13/20 0250 07/13/20 2045 07/14/20 0400 07/14/20 0832  NA 144   < > 143  --  141 135  K 5.7*   < > 5.7*  --  5.2* 5.3*  CL 113*  --  109  --  105  --   CO2 21*  --  20*  --  23  --   GLUCOSE 196*  --  346*  --  218*  --   BUN 121*  --  133*  --  108*  --   CREATININE 5.15*  --  5.58*  --  5.17*  --   CALCIUM 7.6*  --  7.7*  --  7.9*  --   PHOS 8.3*  --  8.8* 7.2*  --   --    < > = values in this interval not displayed.   Liver Function Tests: Recent Labs  Lab 07/12/20 0432 07/13/20 0250 07/14/20 0400  AST 43* 43* 54*  ALT '24 19 17  ' ALKPHOS 105  89 85  BILITOT 0.7 0.3 0.6  PROT 4.9* 4.6* 4.8*  ALBUMIN 1.9* 1.8* 2.1*   No results for input(s): LIPASE, AMYLASE in the last 168 hours. No results for input(s): AMMONIA in the last 168 hours. CBC: Recent Labs  Lab 07/08/20 0419 07/09/20 0747 07/11/20 0330 07/11/20 1202 07/12/20 0432 07/12/20 0847 07/12/20 1819 07/13/20 0248 07/13/20 0250 07/14/20 0400 07/14/20 0832  WBC 15.9*   < > 5.4  --  7.3  --  6.5  --  7.3 6.3  --   NEUTROABS 13.7*  --   --   --   --   --   --   --   --   --   --   HGB 12.3*   < > 12.4*   < > 11.8*   < > 10.2*   < > 9.6* 9.2* 8.5*  HCT 37.6*   < > 39.6   < > 36.8*   < > 32.9*   < > 31.1* 30.3* 25.0*  MCV 89.1   < > 90.6  --  91.8  --  91.6  --  92.3 92.7  --   PLT 126*   < > 113*  --  77*  --  65*  --  64* 62*  --    < > = values in this interval not displayed.   Cardiac Enzymes: No results for input(s): CKTOTAL, CKMB, CKMBINDEX, TROPONINI in the last 168 hours. CBG: Recent Labs  Lab 07/13/20 2105 07/13/20 2132 07/13/20 2339 07/14/20 0319 07/14/20 0802  GLUCAP 166* 165* 215* 190* 205*    Iron Studies: No results for input(s): IRON, TIBC, TRANSFERRIN, FERRITIN in the last 72 hours. Studies/Results: DG CHEST PORT 1 VIEW  Result Date: 07/14/2020 CLINICAL DATA:  Respiratory distress,  COVID-19 positive EXAM: PORTABLE CHEST 1 VIEW COMPARISON:  07/12/2020 chest radiograph. FINDINGS: Endotracheal tube tip is 3.5 cm above the carina. Enteric tube enters stomach with the tip not seen on this image. Right internal jugular central venous catheter terminates in the lower third of the SVC. Stable cardiomediastinal silhouette with normal heart size. No pneumothorax. No pleural effusion. Severe patchy opacities throughout both lungs have worsened. IMPRESSION: 1. Well-positioned support structures. No pneumothorax. 2. Severe patchy opacities throughout both lungs have worsened, compatible with COVID-19 pneumonia. Electronically Signed   By: Ilona Sorrel M.D.   On: 07/14/2020 08:44   DG CHEST PORT 1 VIEW  Result Date: 07/12/2020 CLINICAL DATA:  Mental status change. EXAM: PORTABLE CHEST 1 VIEW COMPARISON:  July 11, 2020 FINDINGS: There are persistent diffuse bilateral hazy airspace opacities which have not significantly changed since the prior study. The right-sided central venous catheter is unchanged in positioning. There is a new left-sided dialysis catheter that courses across the patient's midline and likely terminates in the right internal jugular vein. The enteric tube terminates below the left hemidiaphragm. The endotracheal tube terminates above the carina. There is no pneumothorax. There may be small bilateral pleural effusions. IMPRESSION: 1. New left-sided dialysis catheter that courses across the patient's midline and likely terminates in the right internal jugular vein. Repositioning is recommended. 2. Remaining lines and tubes as above. 3. No significant interval change in appearance of the lungs bilaterally. These results will be called to the ordering clinician or representative by the Radiologist Assistant, and communication documented in the PACS or [employer]. Electronically Signed   By: Constance Holster M.D.   On: 07/12/2020 19:24   Medications: Infusions: . sodium  chloride Stopped (07/12/20  4239)  . sodium chloride    . sodium chloride    . ceFEPime (MAXIPIME) IV    . feeding supplement (NEPRO CARB STEADY) 45 mL/hr at 07/14/20 0600  . fentaNYL infusion INTRAVENOUS 150 mcg/hr (07/14/20 0800)  . heparin 950 Units/hr (07/14/20 0800)  . phenylephrine (NEO-SYNEPHRINE) Adult infusion 20 mcg/min (07/14/20 0806)  . propofol (DIPRIVAN) infusion 50 mcg/kg/min (07/14/20 0800)  . vancomycin      Scheduled Medications: . amiodarone  400 mg Oral TID  . chlorhexidine gluconate (MEDLINE KIT)  15 mL Mouth Rinse BID  . Chlorhexidine Gluconate Cloth  6 each Topical Q0600  . Chlorhexidine Gluconate Cloth  6 each Topical Q0600  . docusate  100 mg Per Tube BID  . feeding supplement (PROSource TF)  45 mL Per Tube TID  . free water  250 mL Per Tube Q4H  . insulin aspart  0-9 Units Subcutaneous Q4H  . insulin glargine  10 Units Subcutaneous QHS  . mouth rinse  15 mL Mouth Rinse 10 times per day  . metoprolol tartrate  25 mg Oral Q6H  . multivitamin with minerals  1 tablet Per Tube Daily  . pantoprazole sodium  40 mg Per Tube Daily  . polyethylene glycol  17 g Per Tube Daily  . QUEtiapine  50 mg Per Tube BID  . sodium chloride flush  10-40 mL Intracatheter Q12H  . sodium chloride flush  3 mL Intravenous Q12H  . vancomycin variable dose per unstable renal function (pharmacist dosing)   Does not apply See admin instructions    have reviewed scheduled and prn medications.  Physical Exam: Visualized thru the glass- calm on vent -  discussed with nursing-  Some pitting edema-    07/14/2020,8:58 AM  LOS: 11 days

## 2020-07-14 NOTE — Progress Notes (Addendum)
NAME:  Gregory [name], MRN:  [id number], DOB:  [birth year], LOS: 11 ADMISSION DATE:  07/14/2020, CONSULTATION DATE:  07/09/2020 REFERRING MD:  Elgergawy, CHIEF COMPLAINT:  SOB, hypoxemia   Brief History   69 year old male with history of diabetes mellitus, hypertension, gout and CVA without focal deficits presented on 7/28 with hypoxia on non-rebreather. Code sepsis initiated s/p fluid resuscitation. Admitted to ICU for acute hypoxic respiratory failure 2/2 COVID pneumonia. Course c/b renal failure on dialysis.  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 Asthma   Significant Hospital Events   7/29 admit to ICU 7/31 transfer to floor 8/3 transferred to ICU for worsening hypoxia  8/5 intubated  Consults:  PCCM  Procedures:  None  Significant Diagnostic Tests:  CXR 7/28> Diffuse bilateral airspace disease, pneumonia v edema CXR 8/4 > Diffuse bilateral airspace disease with air bronchograms, progression since prior study CXR 8/8 >> Diffuse bilateral opacities more dense than prior, central opacities worsened   Micro Data:  COVID positive 7/28 Blood cx 7/28 > negative  Urine cx 7/28> negative  MRSA 7/29 > negative  Antimicrobials:  Vancomycin 7/28 Cefepime 7/28 Flagyl 7/28 Remdesivir 7/28> 8/2 Tocilizumab 7/28 Vanc/cefepime 8/8 >>  Interim history/subjective:  Worsened oxygenation overnight, most recent blodo gas with wrosened hypoxemia and hypercarbia on FiO2 90%, PEEP 12, RR, 28. CXR worse. Fevered at 7 am to 101.5. LRCx ordered. CVVHD to start to assist with volume removal.  Objective   Blood pressure (!) 96/49, pulse 75, temperature (!) 101.5 F (38.6 C), temperature source Axillary, resp. rate 15, height 5\' 7"  (1.702 m), weight 100.5 kg, SpO2 92 %.    Vent Mode: PRVC FiO2 (%):  [60 %-90 %] 90 % Set Rate:  [28 bmp] 28 bmp Vt Set:  [420 mL] 420 mL PEEP:  [12 cmH20] 12 cmH20 Plateau Pressure:  [17 cmH20-20  cmH20] 17 cmH20   Intake/Output Summary (Last 24 hours) at 07/14/2020 0915 Last data filed at 07/14/2020 0900 Gross per 24 hour  Intake 2506.26 ml  Output 620 ml  Net 1886.26 ml   Filed Weights   07/13/20 0500 07/13/20 1001 07/14/20 0401  Weight: 100.1 kg 101 kg 100.5 kg    Examination: General: critically ill appearing elderly gentleman; heavily sedated and mechanically ventilated  HENT: [place]/AT, anicteric sclerae, PERRL, ET tube in place  Lungs: diffuse rales bilaterally, on ventilator  Cardiovascular: RRR, no m/r/g Abdomen: nondistended, soft, nontender, normoactive bowel sounds Extremities: warm/dry, pitting edema in b/l LEs Neuro: heavily sedated, PERRL  Resolved Hospital Problem list    Assessment & Plan:  Acute hypoxic and hypercarbic respiratory failure secondary to moderate ARDS in setting of COVID 19 pneumonia: Worsening gas exchange overnight. Peak pressures not up and driving pressure 13 on PEEP 12. Driving pressure 11 with increase PEEP 16. On 100% FiO2, increase PEEP to help oxygenation. RR and Vt increased given worsening CO2 retention. Lung compliance does not seem to be worsening. Febrile so Pna considered, but given stable mechanics favor volume as primary driver.  - Remains on full vent support. - Continue full ventilator support PaO2 goal 55-80, pH goal > 7.2  - abx as below  Fever: Noted at shift change 8/8. CXR worse. --Vanc/cefepime, LRCx ordered  Afib RVR: Flips in AFIB and NSR. NSR this AM. -continue amio oral load  Acute toxic metabolic encephalopathy Currently on fentanyl and propofol gtt, continue given worsening gas exchange -RASS goal -1 to -2  Acute  on chronic renal insufficiency with renal failure Secondary hyperparathyroidism  --CVVHD  Hypotension: Multifactorial - sedation, Afib, possible septic shock --MAP > 65, phenylephrine given Afib w/ RVR, can trial levo as is in NSR  Acute LLE DVT Able to Doppler LLE pulse AM 8/7 . - LLE arterial  US, consider vascular consult pending results  - Heparin dosing per pharmacy   Hyperglycemia in setting of diabetes mellitus: Elevated in setting of steroids and starting TFs. Insulin gtt evening 8/7 now off. - Lantus 10 u qhs, SSI   Best practice:  Diet: tube feeds  Pain/Anxiety/Delirium protocol (if indicated): per protocol VAP protocol (if indicated): per protocol DVT prophylaxis: Heparin GI prophylaxis: Protonix Glucose control: Lantus + SSI Mobility: Bed rest  Code Status: FULL  Family Communication: will update daughter  Disposition: ICU  Labs    Critical care time: 45 minutes    Critical care time was exclusive of separately billable procedures and treating other patients.  Critical care was necessary to treat or prevent imminent or life-threatening deterioration.  Critical care was time spent personally by me on the following activities: development of treatment plan with patient and/or surrogate as well as nursing, discussions with consultants, evaluation of patient's response to treatment, examination of patient, obtaining history from patient or surrogate, ordering and performing treatments and interventions, ordering and review of laboratory studies, ordering and review of radiographic studies, pulse oximetry, re-evaluation of patient's condition and participation in multidisciplinary rounds.

## 2020-07-14 NOTE — Progress Notes (Signed)
ANTICOAGULATION CONSULT NOTE  Pharmacy Consult:  Heparin Indication: DVT  Labs: Recent Labs    07/12/20 0432 07/12/20 0847 07/12/20 1208 07/12/20 1516 07/12/20 1819 07/12/20 1819 07/12/20 1822 07/13/20 0248 07/13/20 0248 07/13/20 0250 07/13/20 1119 07/13/20 1119 07/13/20 1404 07/13/20 2045 07/14/20 0400  HGB 11.8*   < >  --   --  10.2*   < >  --  9.2*   < > 9.6*  --   --   --   --  9.2*  HCT 36.8*   < >  --   --  32.9*   < >  --  27.0*  --  31.1*  --   --   --   --  30.3*  PLT 77*  --   --   --  65*  --   --   --   --  64*  --   --   --   --  62*  APTT 121*  --   --    < >  --   --   --   --   --   --  54*   < > 58* 56* 59*  HEPARINUNFRC 1.40*  --   --   --   --   --   --   --   --   --  0.97*  --   --   --  0.86*  CREATININE 4.56*  --  4.72*  --   --   --  5.15*  --   --  5.58*  --   --   --   --   --    < > = values in this interval not displayed.    Assessment: 21 YOM presented with hypoxia and confusion in setting of Covid-19.  Patient transferred to the ICU due to respiratory distress.  He has been on Eliquis for acute bilateral DVT, last dose on 07/09/20 AM.  Pharmacy consulted to transition patient to IV heparin due to NPO status/AMS.  Patient has worsening AKI.  D-dimer elevated.    APTT and HL are not yet correlating - using aPTT for now.  APTT 59 sec Goal of Therapy:  Heparin level 0.3-0.7 units/ml aPTT 66 - 102 seconds Monitor platelets by anticoagulation protocol: Yes   Plan:  Increase heparin gtt to 950 units/hr Check 6 hr aPTT Daily heparin level, aPTT and CBC  Thanks for allowing pharmacy to be a part of this patient's care.  Talbert Cage, PharmD Clinical Pharmacist

## 2020-07-14 NOTE — Progress Notes (Signed)
ANTICOAGULATION CONSULT NOTE  Pharmacy Consult:  Heparin Indication: DVT  No Known Allergies  Patient Measurements: Height: 5\' 7"  (170.2 cm) Weight: 100.5 kg (221 lb 9 oz) IBW/kg (Calculated) : 66.1 Heparin Dosing Weight: 88 kg  Vital Signs: Temp: 101.5 F (38.6 C) (08/08 0800) Temp Source: Axillary (08/08 0800) BP: 117/58 (08/08 1400) Pulse Rate: 68 (08/08 1400)  Labs: Recent Labs    07/12/20 0432 07/12/20 0847 07/12/20 1516 07/12/20 1822 07/13/20 0248 07/13/20 0250 07/13/20 0250 07/13/20 1119 07/13/20 1404 07/13/20 2045 07/14/20 0400 07/14/20 0400 07/14/20 0832 07/14/20 0832 07/14/20 0933 07/14/20 1026 07/14/20 1304  HGB 11.8*   < >  --   --    < > 9.6*   < >  --   --   --  9.2*   < > 8.5*   < > 9.4* 9.2*  --   HCT 36.8*   < >  --   --    < > 31.1*   < >  --   --   --  30.3*   < > 25.0*  --  30.3* 27.0*  --   PLT 77*   < >  --   --   --  64*  --   --   --   --  62*  --   --   --  82*  --   --   APTT 121*   < >   < >  --   --   --   --  54*   < > 56* 59*  --   --   --   --   --  47*  HEPARINUNFRC 1.40*  --   --   --   --   --   --  0.97*  --   --  0.86*  --   --   --   --   --   --   CREATININE 4.56*   < >  --  5.15*  --  5.58*  --   --   --   --  5.17*  --   --   --   --   --   --    < > = values in this interval not displayed.    Estimated Creatinine Clearance: 15.5 mL/min (A) (by C-G formula based on SCr of 5.17 mg/dL (H)).   Medical History: History reviewed. No pertinent past medical history.   Assessment: 55 YOM presented with hypoxia and confusion in setting of Covid-19.  Patient transferred to the ICU due to respiratory distress.  He has been on Eliquis for acute bilateral DVT, last dose on 07/09/20 AM.  Pharmacy consulted to transition patient to IV heparin due to NPO status/AMS.  Patient has worsening AKI.  D-dimer elevated.    APTT and HL are not yet correlating - using aPTT for now.  APTT sub-therapeutic and trending down despite rate increases.   Bleeding from mouth is persistent but better compared to last week per RN.  Platelet count stabilizing.  Goal of Therapy:  Heparin level 0.3-0.7 units/ml aPTT 66 - 102 seconds Monitor platelets by anticoagulation protocol: Yes   Plan:  Increase heparin gtt to 1100 units/hr Check 6 hr aPTT Daily heparin level, aPTT and CBC Monitor for resolution/worsening of bleeding from mouth  Leny Morozov D. 09/08/20, PharmD, BCPS, BCCCP 07/14/2020, 2:19 PM

## 2020-07-14 NOTE — Progress Notes (Signed)
   Palliative Medicine Inpatient Follow Up Note  HPI: 69 y.o. male  with past medical history of DM, HTN, gout, and CVA w/o focal deficits admitted on 07/25/2020 with COVID pneumonia.  Patient required intubation early AM 8/5 d/t respiratory failure. PMT consulted to discuss GOC.  Today's Discussion (07/14/2020): Chart reviewed. Patient presently on pressors, full vent support, and receiving CRRT.   I called patients daughter, Gregory [name]. We discussed that her fathers condition is worse today. We talked about his present situation as having a guarded prognosis. She stated that she knows the medical team is offering all that they can presently and she has faith that her father will pull through. She expresses that she cannot focus on the negative.I shared with her that I understand this. We reviewed his cardiac, lung, and renal function together. We discussed how right now things are quite disarrayed and even doing everything can sometimes not preclude our body from failing. She understood this.  Gregory [name] requests that tomorrow she receive an update on when isolation will be removed.   Discussed the importance of continued conversation with family and their  medical providers regarding overall plan of care and treatment options, ensuring decisions are within the context of the patients values and GOCs.  Questions and concerns addressed   The Palliative team will remain involved to help support [name] and his family during this difficult time. Gregory [name] states that she feels hearing from our team daily is of benefit.   Primary Decision Maker Gregory [name] (Daughter) [phone]  SUMMARY OF RECOMMENDATIONS   - Full code/ Full scope - PMT will follow for support - Spiritual support   Time Spent: 25 Greater than 50% of the time was spent in counseling and coordination of care ______________________________________________________________________________________ [name] [facility]  Palliative Medicine Team Team Cell Phone: [phone] Please utilize secure chat with additional questions, if there is no response within 30 minutes please call the above phone number  Palliative Medicine Team providers are available by phone from 7am to 7pm daily and can be reached through the team cell phone.  Should this patient require assistance outside of these hours, please call the patient's attending physician.

## 2020-07-14 NOTE — Progress Notes (Signed)
VASCULAR LAB    Left lower extremity arterial duplex completed.     Preliminary report:  See CV proc for preliminary results.  Khasir Woodrome, RVT 07/14/2020, 2:11 PM

## 2020-07-14 NOTE — Progress Notes (Addendum)
Pharmacy Antibiotic Note  Gregory [name] is a 69 y.o. male admitted on 07/07/2020 with hypoxia and confusion in setting of Covid-19.  Pharmacy has been consulted for vancomycin dosing for PNA.  First dose of cefepime also ordered.  Renal function worsened over the course of hospitalization and patient received a short course of hemodialysis on 07/13/20.  Afebrile, WBC WNL, LA down to 1.6.  Plan: Vanc 2000mg  IV x 1  Change cefepime to 2gm IV x 1 Monitor Renal plans and may need to dose vanc per level, clinical progress  Height: 5\' 7"  (170.2 cm) Weight: 100.5 kg (221 lb 9 oz) IBW/kg (Calculated) : 66.1  Temp (24hrs), Avg:98.9 F (37.2 C), Min:97.6 F (36.4 C), Max:101.5 F (38.6 C)  Recent Labs  Lab 07/11/20 0330 07/11/20 0330 07/11/20 0339 07/11/20 2103 07/12/20 0432 07/12/20 1208 07/12/20 1215 07/12/20 1819 07/12/20 1822 07/13/20 0250 07/13/20 0523 07/14/20 0400  WBC 5.4  --   --   --  7.3  --   --  6.5  --  7.3  --  6.3  CREATININE 2.64*   < >  --   --  4.56* 4.72*  --   --  5.15* 5.58*  --  5.17*  LATICACIDVEN  --   --  2.8* 2.5*  --   --  2.5*  --   --   --  1.6  --    < > = values in this interval not displayed.    Estimated Creatinine Clearance: 15.5 mL/min (A) (by C-G formula based on SCr of 5.17 mg/dL (H)).    No Known Allergies  Cefepime x1 7/28, again on 8/8 Vanc 8/8 >>  Remdesivir 7/28>>8/1 Actemra x1 7/28  Decadron 7/28 >> (8/6)  7/28 covid - positive 7/28 UCx - negative 7/28 BCx - negative 7/29 MRSA PCR - negative 8/8 TA - 8/8 BCx -   Gregory [name] D. 10/8, PharmD, BCPS, BCCCP 07/14/2020, 8:47 AM   ======================================  Addendum: Start CRRT Schedule vanc 1gm IV Q24H post loading dose Monitor CRRT tolerance/interruption, clinical progress, vanc level as indicated  Gregory [name] D. 09/13/2020, PharmD, BCPS, BCCCP 07/14/2020, 9:21 AM

## 2020-07-15 DIAGNOSIS — Z9911 Dependence on respirator [ventilator] status: Secondary | ICD-10-CM

## 2020-07-15 LAB — POCT I-STAT 7, (LYTES, BLD GAS, ICA,H+H)
Acid-base deficit: 7 mmol/L — ABNORMAL HIGH (ref 0.0–2.0)
Acid-base deficit: 8 mmol/L — ABNORMAL HIGH (ref 0.0–2.0)
Bicarbonate: 22.9 mmol/L (ref 20.0–28.0)
Bicarbonate: 20.9 mmol/L (ref 20.0–28.0)
Calcium, Ion: 0.98 mmol/L — ABNORMAL LOW (ref 1.15–1.40)
Calcium, Ion: 0.95 mmol/L — ABNORMAL LOW (ref 1.15–1.40)
HCT: 23 % — ABNORMAL LOW (ref 39.0–52.0)
HCT: 23 % — ABNORMAL LOW (ref 39.0–52.0)
Hemoglobin: 7.8 g/dL — ABNORMAL LOW (ref 13.0–17.0)
Hemoglobin: 7.8 g/dL — ABNORMAL LOW (ref 13.0–17.0)
O2 Saturation: 69 %
O2 Saturation: 74 %
Patient temperature: 35.4
Potassium: 4.8 mmol/L (ref 3.5–5.1)
Potassium: 5.1 mmol/L (ref 3.5–5.1)
Sodium: 135 mmol/L (ref 135–145)
Sodium: 134 mmol/L — ABNORMAL LOW (ref 135–145)
TCO2: 25 mmol/L (ref 22–32)
TCO2: 23 mmol/L (ref 22–32)
pCO2 arterial: 74.4 mmHg (ref 32.0–48.0)
pCO2 arterial: 62.9 mmHg — ABNORMAL HIGH (ref 32.0–48.0)
pH, Arterial: 7.096 — CL (ref 7.350–7.450)
pH, Arterial: 7.12 — CL (ref 7.350–7.450)
pO2, Arterial: 50 mmHg — ABNORMAL LOW (ref 83.0–108.0)
pO2, Arterial: 48 mmHg — ABNORMAL LOW (ref 83.0–108.0)

## 2020-07-15 LAB — MAGNESIUM: Magnesium: 2.6 mg/dL — ABNORMAL HIGH (ref 1.7–2.4)

## 2020-07-15 LAB — RENAL FUNCTION PANEL
Albumin: 1.7 g/dL — ABNORMAL LOW (ref 3.5–5.0)
Anion gap: 14 (ref 5–15)
BUN: 59 mg/dL — ABNORMAL HIGH (ref 8–23)
CO2: 21 mmol/L — ABNORMAL LOW (ref 22–32)
Calcium: 7.4 mg/dL — ABNORMAL LOW (ref 8.9–10.3)
Chloride: 101 mmol/L (ref 98–111)
Creatinine, Ser: 2.97 mg/dL — ABNORMAL HIGH (ref 0.61–1.24)
GFR calc Af Amer: 24 mL/min — ABNORMAL LOW (ref >60)
GFR calc non Af Amer: 21 mL/min — ABNORMAL LOW (ref >60)
Glucose, Bld: 192 mg/dL — ABNORMAL HIGH (ref 70–99)
Phosphorus: 6.2 mg/dL — ABNORMAL HIGH (ref 2.5–4.6)
Potassium: 5.1 mmol/L (ref 3.5–5.1)
Sodium: 136 mmol/L (ref 135–145)

## 2020-07-15 LAB — GLUCOSE, CAPILLARY
Glucose-Capillary: 210 mg/dL — ABNORMAL HIGH (ref 70–99)
Glucose-Capillary: 212 mg/dL — ABNORMAL HIGH (ref 70–99)
Glucose-Capillary: 221 mg/dL — ABNORMAL HIGH (ref 70–99)
Glucose-Capillary: 185 mg/dL — ABNORMAL HIGH (ref 70–99)
Glucose-Capillary: 143 mg/dL — ABNORMAL HIGH (ref 70–99)

## 2020-07-15 LAB — COMPREHENSIVE METABOLIC PANEL
ALT: 17 U/L (ref 0–44)
AST: 62 U/L — ABNORMAL HIGH (ref 15–41)
Albumin: 1.7 g/dL — ABNORMAL LOW (ref 3.5–5.0)
Alkaline Phosphatase: 92 U/L (ref 38–126)
Anion gap: 12 (ref 5–15)
BUN: 73 mg/dL — ABNORMAL HIGH (ref 8–23)
CO2: 22 mmol/L (ref 22–32)
Calcium: 7.4 mg/dL — ABNORMAL LOW (ref 8.9–10.3)
Chloride: 102 mmol/L (ref 98–111)
Creatinine, Ser: 3.61 mg/dL — ABNORMAL HIGH (ref 0.61–1.24)
GFR calc Af Amer: 19 mL/min — ABNORMAL LOW (ref >60)
GFR calc non Af Amer: 16 mL/min — ABNORMAL LOW (ref >60)
Glucose, Bld: 241 mg/dL — ABNORMAL HIGH (ref 70–99)
Potassium: 4.9 mmol/L (ref 3.5–5.1)
Sodium: 136 mmol/L (ref 135–145)
Total Bilirubin: 0.6 mg/dL (ref 0.3–1.2)
Total Protein: 4.5 g/dL — ABNORMAL LOW (ref 6.5–8.1)

## 2020-07-15 LAB — HEPARIN LEVEL (UNFRACTIONATED): Heparin Unfractionated: 1.12 IU/mL — ABNORMAL HIGH (ref 0.30–0.70)

## 2020-07-15 LAB — CBC
HCT: 27.7 % — ABNORMAL LOW (ref 39.0–52.0)
Hemoglobin: 8.7 g/dL — ABNORMAL LOW (ref 13.0–17.0)
MCH: 28.9 pg (ref 26.0–34.0)
MCHC: 31.4 g/dL (ref 30.0–36.0)
MCV: 92 fL (ref 80.0–100.0)
Platelets: 48 10*3/uL — ABNORMAL LOW (ref 150–400)
RBC: 3.01 MIL/uL — ABNORMAL LOW (ref 4.22–5.81)
RDW: 14 % (ref 11.5–15.5)
WBC: 15.5 10*3/uL — ABNORMAL HIGH (ref 4.0–10.5)
nRBC: 0.8 % — ABNORMAL HIGH (ref 0.0–0.2)

## 2020-07-15 LAB — TRIGLYCERIDES: Triglycerides: 158 mg/dL — ABNORMAL HIGH (ref <150)

## 2020-07-15 LAB — APTT: aPTT: 113 seconds — ABNORMAL HIGH (ref 24–36)

## 2020-07-15 MED ORDER — STERILE WATER FOR INJECTION IV SOLN
INTRAVENOUS | Status: DC
  Filled 2020-07-15 (×4): qty 850

## 2020-07-15 MED ORDER — SODIUM BICARBONATE 8.4 % IV SOLN
100.0000 meq | Freq: Once | INTRAVENOUS | Status: AC
  Administered 2020-07-15: 100 meq via INTRAVENOUS
  Filled 2020-07-15: qty 100

## 2020-07-15 MED ORDER — SODIUM CHLORIDE 0.9% FLUSH
10.0000 mL | Freq: Two times a day (BID) | INTRAVENOUS | Status: DC
  Administered 2020-07-15 – 2020-07-16 (×3): 10 mL

## 2020-07-15 MED ORDER — AMIODARONE LOAD VIA INFUSION
150.0000 mg | Freq: Once | INTRAVENOUS | Status: DC
  Filled 2020-07-15: qty 83.34

## 2020-07-15 MED ORDER — SODIUM CHLORIDE 0.9% FLUSH: 10.0000 mL | INTRAVENOUS | Status: DC | PRN

## 2020-07-15 MED ORDER — VASOPRESSIN 20 UNITS/100 ML INFUSION FOR SHOCK
0.0000 [IU]/min | INTRAVENOUS | Status: DC
  Administered 2020-07-15 – 2020-07-16 (×3): 0.03 [IU]/min via INTRAVENOUS
  Filled 2020-07-15 (×4): qty 100

## 2020-07-15 MED ORDER — INSULIN ASPART 100 UNIT/ML ~~LOC~~ SOLN
2.0000 [IU] | SUBCUTANEOUS | Status: DC
  Administered 2020-07-15 – 2020-07-16 (×6): 2 [IU] via SUBCUTANEOUS

## 2020-07-15 MED ORDER — AMIODARONE HCL IN DEXTROSE 360-4.14 MG/200ML-% IV SOLN
INTRAVENOUS | Status: AC
  Administered 2020-07-15: 60 mg/h via INTRAVENOUS
  Filled 2020-07-15: qty 200

## 2020-07-15 MED ORDER — AMIODARONE HCL IN DEXTROSE 360-4.14 MG/200ML-% IV SOLN
60.0000 mg/h | INTRAVENOUS | Status: AC
  Administered 2020-07-15: 60 mg/h via INTRAVENOUS
  Filled 2020-07-15: qty 200

## 2020-07-15 MED ORDER — ATROPINE SULFATE 1 MG/10ML IJ SOSY
PREFILLED_SYRINGE | INTRAMUSCULAR | Status: AC
  Filled 2020-07-15: qty 10

## 2020-07-15 MED ORDER — AMIODARONE HCL IN DEXTROSE 360-4.14 MG/200ML-% IV SOLN
30.0000 mg/h | INTRAVENOUS | Status: DC
  Administered 2020-07-16: 30 mg/h via INTRAVENOUS
  Filled 2020-07-15: qty 200

## 2020-07-15 MED FILL — Sodium Chloride IV Soln 0.9%: INTRAVENOUS | Qty: 250 | Status: AC

## 2020-07-15 MED FILL — Phenylephrine HCl IV Soln 10 MG/ML: INTRAVENOUS | Qty: 1 | Status: AC

## 2020-07-15 NOTE — Progress Notes (Addendum)
NAME:  Gregory [name], MRN:  [id number], DOB:  1951-07-25, LOS: 12 ADMISSION DATE:  06/20/2020, CONSULTATION DATE:  07/09/2020 REFERRING MD:  Elgergawy, CHIEF COMPLAINT:  SOB, hypoxemia   Brief History   69 year old male with history of diabetes mellitus, hypertension, gout and CVA without focal deficits presented on 7/28 with hypoxia on non-rebreather. Code sepsis initiated s/p fluid resuscitation. Admitted to ICU for acute hypoxic respiratory failure 2/2 COVID pneumonia. Course c/b renal failure on dialysis.  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  Objective   Blood pressure (!) 116/52, pulse (!) 53, temperature (!) 96 F (35.6 C), temperature source Axillary, resp. rate (!) 35, height 5\' 7"  (1.702 m), weight 109.8 kg, SpO2 97 %.    Vent Mode: PRVC FiO2 (%):  [90 %-100 %] 100 % Set Rate:  [35 bmp] 35 bmp Vt Set:  [520 mL] 520 mL PEEP:  [16 cmH20-18 cmH20] 16 cmH20 Plateau Pressure:  [35 cmH20-36 cmH20] 36 cmH20   Intake/Output Summary (Last 24 hours) at 07/15/2020 0731 Last data filed at 07/15/2020 0600 Gross per 24 hour  Intake 4880.4 ml  Output 1062 ml  Net 3818.4 ml   Filed Weights   07/13/20 1001 07/14/20 0401 07/15/20 0500  Weight: 101 kg 100.5 kg 109.8 kg    Examination: Gen:      No acute distress, critically ill-appearing HEENT:  EOMI, sclera anicteric Neck:     No  masses; no thyromegaly, ET tube Lungs:    Clear to auscultation bilaterally; normal respiratory effort CV:         Regular rate and rhythm; no murmurs Abd:      + bowel sounds; soft, non-tender; no palpable masses, no distension Ext:    1+ edema; adequate peripheral perfusion Skin:      Warm and dry; no rash Neuro: Sedated, unresponsive  Labs/imaging reviewed Significant for pH 7.21/PCO2 57, PO2 63 Hyperkalemia better at 4.9, glucose remains elevated at 241 WBC 15.5, hemoglobin 8.7, platelets 48 Chest x-ray 8/8-stable bilateral airspace opacities  Resolved Hospital Problem list    Assessment & Plan:  Acute hypoxic and hypercarbic respiratory failure secondary to moderate ARDS in setting of COVID 19 pneumonia:  Increasing driving pressure and plateau pressure today He is currently at 8 cc/kg.  Reduce to 6 cc/kg tidal volume Continue vent support Follow chest x-ray, ABG  Leukocytosis, fever On empiric Vanco, cefepime  Afib RVR: Currently normal sinus rhythm Amnio drip if needed  Acute toxic metabolic encephalopathy Currently on fentanyl and propofol gtt, continue given worsening gas exchange -RASS goal -1 to -2  Acute on chronic renal insufficiency with renal failure Secondary hyperparathyroidism  --CVVHD.  Attempt volume removal today.  Follow CVP  Hypotension: Multifactorial - sedation, Afib, possible septic shock Continue Levophed  Acute LLE DVT Able to Doppler LLE pulse AM 8/7 . - LLE arterial 10/7 with no evidence of  occlusive clot - Heparin on hold due to low platelets.   Hyperglycemia in setting of diabetes mellitus: Elevated in setting of steroids and starting TFs. Insulin gtt evening 8/7 now off. - Lantus 10 u qhs, SSI - Start tube feed coverage.   Best practice:  Diet: tube feeds  Pain/Anxiety/Delirium protocol (if indicated): per protocol VAP protocol (if indicated): per protocol DVT prophylaxis: SCDs, Heparin on hold GI prophylaxis: Protonix Glucose  control: Lantus + SSI.  Starting tube feed coverage Mobility: Bed rest  Code Status: FULL  Family Communication: will update daughter  Disposition: ICU  Critical care time: 45 minutes   The patient is critically ill with multiple organ system failure and requires high complexity decision making for assessment and support, frequent evaluation and titration of therapies, advanced monitoring, review of radiographic studies and interpretation of complex data.   Critical Care Time devoted to patient care services, exclusive of separately billable procedures, described in this note is 35 minutes.   [name] MD [facility] Please see [email or web address] for pager details.  07/15/2020, 7:39 AM

## 2020-07-15 NOTE — Progress Notes (Signed)
Pts HR trended down from high 50's/low 60's and maintaining around 43-45. Zoll pads placed on pt. Atropine at bedside. Contact [facility] and made them aware of change in HR. Awaiting orders.

## 2020-07-15 NOTE — Plan of Care (Signed)
  Problem: Nutrition: Goal: Adequate nutrition will be maintained Outcome: Progressing Note: Pt is tolerating tube feeds well.    Problem: Pain Managment: Goal: General experience of comfort will improve Outcome: Progressing   Problem: Safety: Goal: Ability to remain free from injury will improve Outcome: Progressing   Problem: Skin Integrity: Goal: Risk for impaired skin integrity will decrease Outcome: Progressing   Problem: Activity: Goal: Risk for activity intolerance will decrease Outcome: Not Progressing Note: Unable to mobilize patient due to hemodynamic instability.

## 2020-07-15 NOTE — Progress Notes (Signed)
Dr. Isaiah Serge made aware of increased Levophed requirements. SBP 100s with MAP mid 50s. Order for Vasopressin given with a goal SBP >100.

## 2020-07-15 NOTE — Progress Notes (Signed)
ANTICOAGULATION CONSULT NOTE  Pharmacy Consult:  Heparin Indication: DVT  No Known Allergies  Patient Measurements: Height: 5\' 7"  (170.2 cm) Weight: 109.8 kg (242 lb 1 oz) IBW/kg (Calculated) : 66.1 Heparin Dosing Weight: 88 kg  Vital Signs: Temp: 96 F (35.6 C) (08/09 0300) Temp Source: Axillary (08/09 0300) BP: 109/50 (08/09 0530) Pulse Rate: 53 (08/09 0530)  Labs: Recent Labs    07/12/20 1516 07/13/20 0250 07/13/20 1119 07/13/20 1404 07/14/20 0400 07/14/20 0400 07/14/20 0832 07/14/20 0933 07/14/20 0933 07/14/20 1026 07/14/20 1026 07/14/20 1304 07/14/20 1426 07/14/20 1559 07/14/20 2309 07/15/20 0455  HGB   < > 9.6*  --   --  9.2*  --    < > 9.4*   < > 9.2*   < >  --  8.8*  --   --  8.7*  HCT   < > 31.1*  --   --  30.3*  --    < > 30.3*   < > 27.0*  --   --  26.0*  --   --  27.7*  PLT   < > 64*  --   --  62*  --   --  82*  --   --   --   --   --   --   --  48*  APTT   < >  --  54*   < > 59*   < >  --   --   --   --   --  47*  --   --  88* 113*  HEPARINUNFRC  --   --  0.97*  --  0.86*  --   --   --   --   --   --   --   --   --   --  1.12*  CREATININE  --  5.58*  --   --  5.17*  --   --   --   --   --   --   --   --  4.78*  --   --    < > = values in this interval not displayed.    Estimated Creatinine Clearance: 17.5 mL/min (A) (by C-G formula based on SCr of 4.78 mg/dL (H)).   Medical History: History reviewed. No pertinent past medical history.   Assessment: 54 YOM presented with hypoxia and confusion in setting of Covid-19.  Patient transferred to the ICU due to respiratory distress.  He has been on Eliquis for acute bilateral DVT, last dose on 07/09/20 AM.  Pharmacy consulted to transition patient to IV heparin due to NPO status/AMS.  Patient has worsening AKI.  D-dimer elevated.    APTT and HL are not yet correlating - using aPTT for now.  APTT now increased to 113 sec.  PTLC dropped to 48K  Goal of Therapy:  Heparin level 0.3-0.7 units/ml aPTT 66 -  102 seconds Monitor platelets by anticoagulation protocol: Yes   Plan:  Hold heparin until CCM rounds in am  Thanks for allowing pharmacy to be a part of this patient's care.  09/08/20, PharmD Clinical Pharmacist 07/15/2020, 5:53 AM

## 2020-07-15 NOTE — Progress Notes (Signed)
Assisted family with camera/video time via [facility] 

## 2020-07-15 NOTE — Progress Notes (Signed)
Patient ID: Gregory [name], male   DOB: 02-29-1951, 69 y.o.   MRN: [id number]  This NP reviewed medical records, received report from team and assessed the patient from the bedside as follow up for palliative medicine needs and emotional support.  Patient currently intubated, CRRT in progress, requiring pressors for blood pressure support, patient remains hypothermic.  Poor prognosis.  I spoke with daughter/Gregory [name] and her step sister/Gregory [name] by phone for continued education/conversation regarding current medical situation, treatment option decisions,  advanced directive decisions and anticipatory care needs.  I shared with family my concern regarding his guarded prognosis.  Education offered on the limitations of medical interventions to prolong life when the body fails to thrive.    Family understand the seriousness of the medical situation and the medical team's understanding of poor prognosis however at this time they are open to all offered and available medical interventions to prolong life.  I assured family that the medical team is treating patient with  maximum medical interventions, with the goal being stabilization and improvement.  Gregory [name] tells me they are a family of faith and they trust in power of God and prayer.  Gregory [name] and several other family members will be driving to [place] from [place] today and plan to get here this evening some time.  I offered education/information on the updated visitation and masking requirements.  Emotional support offered.  Discussed with family the importance of continued conversation with all family members and the medical providers regarding overall plan of care and treatment options,  ensuring decisions are within the context of the patients values and GOCs.  Questions and concerns addressed   Discussed with Dr Isaiah Serge via secure chat and bedside RN/Lisa  Total time spent on the unit was 35 minutes     PMT will continue to support  holistically   Greater than 50% of the time was spent in counseling and coordination of care  Lorinda Creed NP  Palliative Medicine Team Team Phone # [phone] Pager [phone]

## 2020-07-15 NOTE — Progress Notes (Signed)
[facility] Physician-Brief Progress Note Patient Name: Kodah Maret DOB: January 10, 1951 MRN: [id number]   Date of Service  07/15/2020  HPI/Events of Note  Multiple issues: 1. Bradycardia - HR = 50 and 2. Hypotension - BP = 99/33 with MAP = 54. Patient is on Amiodarone and Propofol IV infusions. Last ABG on 100%/PRVC 35/TV 520/P 18 = 7.120/62.9/48. No room to increase PRVC rate.   eICU Interventions  Plan: 1. D/C Propofol IV infusion.  2. Titrate Fentanyl IV infusion for sedation.  3. NaHCO3 100 meq IV now.  4. NaHCO3 IV infusion at 75 mL/hour. 5. Repeat ABG at 1 AM.     Intervention Category Major Interventions: Acid-Base disturbance - evaluation and management;Respiratory failure - evaluation and management;Arrhythmia - evaluation and management  Jarome Trull Eugene 07/15/2020, 11:05 PM

## 2020-07-15 NOTE — Progress Notes (Signed)
[facility] Progress Note   69 year old BM- appears to have CKD at baseline but unclear to what degree- initial urine with protein- 3 grams - normal sized kidneys c/w medical renal disease- At best crt has been 2.6. 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 Oliguric and BUN of over 100- Issues with hyperkalemia. Family desires full scope of care. did hemodialysis 8/7 and with  decompensation -> CRRT 8/8.  Seen on CRRT through left fem cath Pre/post/dialysate 4/4/2 Unable to UF bec of increasing levo requirements.   2. Hyperkalemia- correct with CRRT-  4 K pre and post but 2 k dialysate  3. Hypertension/volume -    Min UF with HD due to low BP but Fio2 is going up-  Will try some UF with CRRT  4. Anemia - had not been issue but dropping-  No meds yet-  Will give dose of  darbe today   5. COVID PNA-  Vent/steriods- per CCM   Subjective:   Levo req increasing FIO2 100% P18 and sats still not good   Objective:   BP (!) 88/55 (BP Location: Left Arm)   Pulse (!) 53   Temp (!) 90.7 F (32.6 C) (Rectal)   Resp (!) 35   Ht 5' 7" (1.702 m)   Wt 109.8 kg   SpO2 (!) 87%   BMI 37.91 kg/m   Intake/Output Summary (Last 24 hours) at 07/15/2020 1237 Last data filed at 07/15/2020 1200 Gross per 24 hour  Intake 3454 ml  Output 1587 ml  Net 1867 ml   Weight change: 8.8 kg  Physical Exam: GEN: Intubated NECK: Supple, no thyromegaly LUNGS: Ventilated CV: bradycardic ABD: SNDNT  EXT: No lower extremity edema GU: Foley cath to gravity Access: lt fem vasc cath   Imaging: DG CHEST PORT 1 VIEW  Result Date: 07/14/2020 CLINICAL DATA:  COVID-19 pneumonia EXAM: PORTABLE CHEST  1 VIEW COMPARISON:  Chest radiograph from earlier today. FINDINGS: Endotracheal tube tip is 3.2 cm above the carina. Enteric tube enters stomach with the tip not seen on this image. Right internal jugular central venous catheter terminates in middle third of the SVC. Stable cardiomediastinal silhouette with normal heart size. No pneumothorax. No pleural effusion. Extensive patchy hazy opacities throughout both lungs, unchanged. IMPRESSION: 1. Well-positioned support structures. No pneumothorax. 2. Stable extensive patchy hazy opacities throughout both lungs compatible with COVID-19 pneumonia. Electronically Signed   By: Ilona Sorrel M.D.   On: 07/14/2020 14:09   DG CHEST PORT 1 VIEW  Result Date: 07/14/2020 CLINICAL DATA:  Respiratory distress, COVID-19 positive EXAM: PORTABLE CHEST 1 VIEW COMPARISON:  07/12/2020 chest radiograph. FINDINGS: Endotracheal tube tip is 3.5 cm above the carina. Enteric tube enters stomach with the tip not seen on this image. Right internal jugular central venous catheter terminates in the lower third of the SVC. Stable cardiomediastinal silhouette with normal heart size. No pneumothorax. No pleural effusion. Severe patchy opacities throughout both lungs have worsened. IMPRESSION: 1. Well-positioned support structures. No pneumothorax. 2. Severe patchy opacities throughout both lungs have worsened, compatible with COVID-19 pneumonia. Electronically Signed   By: Ilona Sorrel M.D.   On: 07/14/2020 08:44   VAS [place] LOWER EXTREMITY ARTERIAL DUPLEX  Result Date: 07/14/2020 LOWER EXTREMITY ARTERIAL DUPLEX STUDY Indications: Unable to palpate/Doppler pulses in left foot. Other Factors: Covid-19, ventilation, continuous dialysis.  Current ABI: N/A Limitations: Line/bandages in groin Comparison Study: No prior study Performing Technologist: Sharion Dove RVS  Examination Guidelines: A complete evaluation includes B-mode imaging, spectral Doppler, color Doppler, and power Doppler as needed of  all accessible portions of each vessel. Bilateral testing is considered an integral part of a complete examination. Limited examinations for reoccurring indications may be performed as noted.  +----------+--------+-----+--------+---------+---------------------------------+ LEFT      PSV cm/sRatioStenosisWaveform Comments                          +----------+--------+-----+--------+---------+---------------------------------+ CFA Prox                                Not visualized secondary to                                               line/bandages                     +----------+--------+-----+--------+---------+---------------------------------+ SFA Prox  125                  triphasic                                  +----------+--------+-----+--------+---------+---------------------------------+ SFA Mid   119                  triphasic                                  +----------+--------+-----+--------+---------+---------------------------------+ SFA Distal113                  triphasic                                  +----------+--------+-----+--------+---------+---------------------------------+ POP Prox  99                   triphasic                                  +----------+--------+-----+--------+---------+---------------------------------+ POP Distal87                   triphasic                                  +----------+--------+-----+--------+---------+---------------------------------+ ATA Prox  34                   triphasic                                  +----------+--------+-----+--------+---------+---------------------------------+ ATA Mid   94                   triphasic                                  +----------+--------+-----+--------+---------+---------------------------------+  ATA Distal65                   triphasic                                   +----------+--------+-----+--------+---------+---------------------------------+ PTA Prox  182                  triphasic                                  +----------+--------+-----+--------+---------+---------------------------------+ PTA Mid   129                  triphasic                                  +----------+--------+-----+--------+---------+---------------------------------+ PTA Distal99                   triphasic                                  +----------+--------+-----+--------+---------+---------------------------------+  Summary: Left: Normal examination. No evidence of arterial occlusive disease.  See table(s) above for measurements and observations. Electronically signed by Vance Brabham MD on 07/14/2020 at 4:56:13 PM.    Final     Labs: BMET Recent Labs  Lab 07/11/20 0330 07/11/20 1202 07/12/20 0432 07/12/20 0847 07/12/20 1208 07/12/20 1208 07/12/20 1822 07/13/20 0248 07/13/20 0250 07/13/20 0250 07/13/20 2045 07/14/20 0400 07/14/20 0832 07/14/20 1026 07/14/20 1426 07/14/20 1559 07/15/20 0455 07/15/20 1146  NA 146*   < > 144   < > 144   < > 144   < > 143   < >  --  141 135 136 136 137 136 135  K 5.1   < > 6.2*   < > 5.5*   < > 5.7*   < > 5.7*   < >  --  5.2* 5.3* 5.9* 5.6* 5.5* 4.9 4.8  CL 115*  --  112*  --  112*  --  113*  --  109  --   --  105  --   --   --  103 102  --   CO2 20*  --  17*  --  20*  --  21*  --  20*  --   --  23  --   --   --  22 22  --   GLUCOSE 310*  --  287*  --  241*  --  196*  --  346*  --   --  218*  --   --   --  223* 241*  --   BUN 88*  --  109*  --  114*  --  121*  --  133*  --   --  108*  --   --   --  97* 73*  --   CREATININE 2.64*  --  4.56*  --  4.72*  --  5.15*  --  5.58*  --   --  5.17*  --   --   --  4.78* 3.61*  --   CALCIUM 7.9*  --  7.5*  --  8.1*  --  7.6*  --  7.7*  --   --    7.9*  --   --   --  7.4* 7.4*  --   PHOS 7.3*  --  7.7*  --  7.3*  --  8.3*  --  8.8*  --  7.2*  --   --   --   --  6.5*  --   --     < > = values in this interval not displayed.   CBC Recent Labs  Lab 07/13/20 0250 07/13/20 0250 07/14/20 0400 07/14/20 0832 07/14/20 0933 07/14/20 0933 07/14/20 1026 07/14/20 1426 07/15/20 0455 07/15/20 1146  WBC 7.3  --  6.3  --  11.4*  --   --   --  15.5*  --   HGB 9.6*   < > 9.2*   < > 9.4*   < > 9.2* 8.8* 8.7* 7.8*  HCT 31.1*   < > 30.3*   < > 30.3*   < > 27.0* 26.0* 27.7* 23.0*  MCV 92.3  --  92.7  --  95.0  --   --   --  92.0  --   PLT 64*  --  62*  --  82*  --   --   --  48*  --    < > = values in this interval not displayed.    Medications:    . amiodarone  400 mg Oral TID  . chlorhexidine gluconate (MEDLINE KIT)  15 mL Mouth Rinse BID  . Chlorhexidine Gluconate Cloth  6 each Topical Q0600  . docusate  100 mg Per Tube BID  . feeding supplement (PROSource TF)  45 mL Per Tube TID  . insulin aspart  0-9 Units Subcutaneous Q4H  . insulin aspart  2 Units Subcutaneous Q4H  . insulin detemir  30 Units Subcutaneous BID  . mouth rinse  15 mL Mouth Rinse 10 times per day  . multivitamin with minerals  1 tablet Per Tube Daily  . pantoprazole sodium  40 mg Per Tube Daily  . polyethylene glycol  17 g Per Tube Daily  . QUEtiapine  50 mg Per Tube BID  . sodium chloride flush  10-40 mL Intracatheter Q12H  . sodium chloride flush  10-40 mL Intracatheter Q12H  . sodium chloride flush  3 mL Intravenous Q12H      Otelia Santee, MD 07/15/2020, 12:37 PM

## 2020-07-15 NOTE — Progress Notes (Signed)
ANTICOAGULATION CONSULT NOTE  Pharmacy Consult:  Heparin Indication: DVT  No Known Allergies  Patient Measurements: Height: 5\' 7"  (170.2 cm) Weight: 100.5 kg (221 lb 9 oz) IBW/kg (Calculated) : 66.1 Heparin Dosing Weight: 88 kg  Vital Signs: Temp: 96.5 F (35.8 C) (08/08 1956) Temp Source: Axillary (08/08 1956) BP: 96/56 (08/09 0000) Pulse Rate: 56 (08/09 0000)  Labs: Recent Labs    07/12/20 0432 07/12/20 0847 07/12/20 1516 07/13/20 0250 07/13/20 1119 07/13/20 1404 07/14/20 0400 07/14/20 0832 07/14/20 0933 07/14/20 0933 07/14/20 1026 07/14/20 1304 07/14/20 1426 07/14/20 1559 07/14/20 2309  HGB 11.8*   < >   < > 9.6*  --   --  9.2*   < > 9.4*   < > 9.2*  --  8.8*  --   --   HCT 36.8*   < >   < > 31.1*  --   --  30.3*   < > 30.3*  --  27.0*  --  26.0*  --   --   PLT 77*   < >  --  64*  --   --  62*  --  82*  --   --   --   --   --   --   APTT 121*   < >   < >  --  54*   < > 59*  --   --   --   --  47*  --   --  88*  HEPARINUNFRC 1.40*  --   --   --  0.97*  --  0.86*  --   --   --   --   --   --   --   --   CREATININE 4.56*   < >  --  5.58*  --   --  5.17*  --   --   --   --   --   --  4.78*  --    < > = values in this interval not displayed.    Estimated Creatinine Clearance: 16.7 mL/min (A) (by C-G formula based on SCr of 4.78 mg/dL (H)).   Medical History: History reviewed. No pertinent past medical history.   Assessment: 44 YOM presented with hypoxia and confusion in setting of Covid-19.  Patient transferred to the ICU due to respiratory distress.  He has been on Eliquis for acute bilateral DVT, last dose on 07/09/20 AM.  Pharmacy consulted to transition patient to IV heparin due to NPO status/AMS.  Patient has worsening AKI.  D-dimer elevated.    APTT and HL are not yet correlating - using aPTT for now.  APTT 88 sec  Goal of Therapy:  Heparin level 0.3-0.7 units/ml aPTT 66 - 102 seconds Monitor platelets by anticoagulation protocol: Yes   Plan:   Continue heparin gtt at 1100 units/hr Daily heparin level, aPTT and CBC Monitor for resolution/worsening of bleeding from mouth  Thanks for allowing pharmacy to be a part of this patient's care.  09/08/20, PharmD Clinical Pharmacist 07/15/2020, 12:09 AM

## 2020-07-15 NOTE — Progress Notes (Signed)
This chaplain coordinated spiritual care visit with PMT-ML on Tuesday when daughter visits.

## 2020-07-16 ENCOUNTER — Inpatient Hospital Stay (HOSPITAL_COMMUNITY): Payer: No Typology Code available for payment source

## 2020-07-16 DIAGNOSIS — J9611 Chronic respiratory failure with hypoxia: Secondary | ICD-10-CM

## 2020-07-16 DIAGNOSIS — L8990 Pressure ulcer of unspecified site, unspecified stage: Secondary | ICD-10-CM | POA: Insufficient documentation

## 2020-07-16 LAB — COOXEMETRY PANEL
Carboxyhemoglobin: 1.9 % — ABNORMAL HIGH (ref 0.5–1.5)
Methemoglobin: 1.5 % (ref 0.0–1.5)
O2 Saturation: 77.5 %
Total hemoglobin: 7.7 g/dL — ABNORMAL LOW (ref 12.0–16.0)

## 2020-07-16 LAB — CBC
HCT: 24.4 % — ABNORMAL LOW (ref 39.0–52.0)
Hemoglobin: 7.5 g/dL — ABNORMAL LOW (ref 13.0–17.0)
MCH: 28.6 pg (ref 26.0–34.0)
MCHC: 30.7 g/dL (ref 30.0–36.0)
MCV: 93.1 fL (ref 80.0–100.0)
Platelets: 40 10*3/uL — ABNORMAL LOW (ref 150–400)
RBC: 2.62 MIL/uL — ABNORMAL LOW (ref 4.22–5.81)
RDW: 14.6 % (ref 11.5–15.5)
WBC: 11.7 10*3/uL — ABNORMAL HIGH (ref 4.0–10.5)
nRBC: 3.5 % — ABNORMAL HIGH (ref 0.0–0.2)

## 2020-07-16 LAB — POCT I-STAT 7, (LYTES, BLD GAS, ICA,H+H)
Acid-base deficit: 9 mmol/L — ABNORMAL HIGH (ref 0.0–2.0)
Acid-base deficit: 8 mmol/L — ABNORMAL HIGH (ref 0.0–2.0)
Acid-base deficit: 7 mmol/L — ABNORMAL HIGH (ref 0.0–2.0)
Bicarbonate: 20.9 mmol/L (ref 20.0–28.0)
Bicarbonate: 22 mmol/L (ref 20.0–28.0)
Bicarbonate: 22.3 mmol/L (ref 20.0–28.0)
Calcium, Ion: 0.89 mmol/L — CL (ref 1.15–1.40)
Calcium, Ion: 0.91 mmol/L — ABNORMAL LOW (ref 1.15–1.40)
Calcium, Ion: 0.86 mmol/L — CL (ref 1.15–1.40)
HCT: 21 % — ABNORMAL LOW (ref 39.0–52.0)
HCT: 22 % — ABNORMAL LOW (ref 39.0–52.0)
HCT: 21 % — ABNORMAL LOW (ref 39.0–52.0)
Hemoglobin: 7.1 g/dL — ABNORMAL LOW (ref 13.0–17.0)
Hemoglobin: 7.5 g/dL — ABNORMAL LOW (ref 13.0–17.0)
Hemoglobin: 7.1 g/dL — ABNORMAL LOW (ref 13.0–17.0)
O2 Saturation: 69 %
O2 Saturation: 66 %
O2 Saturation: 62 %
Patient temperature: 35.5
Patient temperature: 35.8
Patient temperature: 35.9
Potassium: 5.1 mmol/L (ref 3.5–5.1)
Potassium: 5.2 mmol/L — ABNORMAL HIGH (ref 3.5–5.1)
Potassium: 5.2 mmol/L — ABNORMAL HIGH (ref 3.5–5.1)
Sodium: 136 mmol/L (ref 135–145)
Sodium: 136 mmol/L (ref 135–145)
Sodium: 138 mmol/L (ref 135–145)
TCO2: 23 mmol/L (ref 22–32)
TCO2: 24 mmol/L (ref 22–32)
TCO2: 25 mmol/L (ref 22–32)
pCO2 arterial: 70 mmHg (ref 32.0–48.0)
pCO2 arterial: 70.6 mmHg (ref 32.0–48.0)
pCO2 arterial: 69.8 mmHg (ref 32.0–48.0)
pH, Arterial: 7.074 — CL (ref 7.350–7.450)
pH, Arterial: 7.094 — CL (ref 7.350–7.450)
pH, Arterial: 7.107 — CL (ref 7.350–7.450)
pO2, Arterial: 47 mmHg — ABNORMAL LOW (ref 83.0–108.0)
pO2, Arterial: 45 mmHg — ABNORMAL LOW (ref 83.0–108.0)
pO2, Arterial: 42 mmHg — ABNORMAL LOW (ref 83.0–108.0)

## 2020-07-16 LAB — CULTURE, RESPIRATORY W GRAM STAIN
Culture: NORMAL
Special Requests: NORMAL

## 2020-07-16 LAB — GLUCOSE, CAPILLARY
Glucose-Capillary: 127 mg/dL — ABNORMAL HIGH (ref 70–99)
Glucose-Capillary: 112 mg/dL — ABNORMAL HIGH (ref 70–99)
Glucose-Capillary: 69 mg/dL — ABNORMAL LOW (ref 70–99)
Glucose-Capillary: 113 mg/dL — ABNORMAL HIGH (ref 70–99)
Glucose-Capillary: 94 mg/dL (ref 70–99)

## 2020-07-16 LAB — BASIC METABOLIC PANEL
Anion gap: 16 — ABNORMAL HIGH (ref 5–15)
Anion gap: 23 — ABNORMAL HIGH (ref 5–15)
BUN: 52 mg/dL — ABNORMAL HIGH (ref 8–23)
BUN: 43 mg/dL — ABNORMAL HIGH (ref 8–23)
CO2: 22 mmol/L (ref 22–32)
CO2: 17 mmol/L — ABNORMAL LOW (ref 22–32)
Calcium: 7 mg/dL — ABNORMAL LOW (ref 8.9–10.3)
Calcium: 6.7 mg/dL — ABNORMAL LOW (ref 8.9–10.3)
Chloride: 101 mmol/L (ref 98–111)
Chloride: 100 mmol/L (ref 98–111)
Creatinine, Ser: 2.74 mg/dL — ABNORMAL HIGH (ref 0.61–1.24)
Creatinine, Ser: 2.62 mg/dL — ABNORMAL HIGH (ref 0.61–1.24)
GFR calc Af Amer: 26 mL/min — ABNORMAL LOW (ref >60)
GFR calc Af Amer: 28 mL/min — ABNORMAL LOW (ref >60)
GFR calc non Af Amer: 23 mL/min — ABNORMAL LOW (ref >60)
GFR calc non Af Amer: 24 mL/min — ABNORMAL LOW (ref >60)
Glucose, Bld: 110 mg/dL — ABNORMAL HIGH (ref 70–99)
Glucose, Bld: 101 mg/dL — ABNORMAL HIGH (ref 70–99)
Potassium: 5.2 mmol/L — ABNORMAL HIGH (ref 3.5–5.1)
Potassium: 4.9 mmol/L (ref 3.5–5.1)
Sodium: 139 mmol/L (ref 135–145)
Sodium: 140 mmol/L (ref 135–145)

## 2020-07-16 LAB — HEPARIN LEVEL (UNFRACTIONATED): Heparin Unfractionated: 0.13 IU/mL — ABNORMAL LOW (ref 0.30–0.70)

## 2020-07-16 LAB — HEMOGLOBIN AND HEMATOCRIT, BLOOD
HCT: 24.1 % — ABNORMAL LOW (ref 39.0–52.0)
Hemoglobin: 7.4 g/dL — ABNORMAL LOW (ref 13.0–17.0)

## 2020-07-16 LAB — TRIGLYCERIDES: Triglycerides: 165 mg/dL — ABNORMAL HIGH (ref <150)

## 2020-07-16 LAB — PHOSPHORUS: Phosphorus: 6.9 mg/dL — ABNORMAL HIGH (ref 2.5–4.6)

## 2020-07-16 LAB — APTT: aPTT: 37 seconds — ABNORMAL HIGH (ref 24–36)

## 2020-07-16 LAB — LACTIC ACID, PLASMA: Lactic Acid, Venous: 10.6 mmol/L (ref 0.5–1.9)

## 2020-07-16 LAB — MAGNESIUM: Magnesium: 2.5 mg/dL — ABNORMAL HIGH (ref 1.7–2.4)

## 2020-07-16 MED ORDER — DEXTROSE 50 % IV SOLN
INTRAVENOUS | Status: AC
  Administered 2020-07-16: 50 mL
  Filled 2020-07-16: qty 50

## 2020-07-16 MED ORDER — GLYCOPYRROLATE 0.2 MG/ML IJ SOLN: 0.2000 mg | INTRAMUSCULAR | Status: DC | PRN

## 2020-07-16 MED ORDER — POLYVINYL ALCOHOL 1.4 % OP SOLN
1.0000 [drp] | Freq: Four times a day (QID) | OPHTHALMIC | Status: DC | PRN
  Filled 2020-07-16: qty 15

## 2020-07-16 MED ORDER — MORPHINE BOLUS VIA INFUSION
5.0000 mg | INTRAVENOUS | Status: DC | PRN
  Filled 2020-07-16: qty 5

## 2020-07-16 MED ORDER — GLYCOPYRROLATE 1 MG PO TABS: 1.0000 mg | ORAL_TABLET | ORAL | Status: DC | PRN

## 2020-07-16 MED ORDER — SODIUM BICARBONATE 8.4 % IV SOLN
INTRAVENOUS | Status: AC
  Administered 2020-07-16: 50 meq via INTRAVENOUS
  Filled 2020-07-16: qty 50

## 2020-07-16 MED ORDER — SODIUM BICARBONATE 8.4 % IV SOLN
100.0000 meq | Freq: Once | INTRAVENOUS | Status: AC
  Administered 2020-07-16: 100 meq via INTRAVENOUS
  Filled 2020-07-16: qty 100

## 2020-07-16 MED ORDER — DIPHENHYDRAMINE HCL 50 MG/ML IJ SOLN: 25.0000 mg | INTRAMUSCULAR | Status: DC | PRN

## 2020-07-16 MED ORDER — ACETAMINOPHEN 325 MG PO TABS: 650.0000 mg | ORAL_TABLET | Freq: Four times a day (QID) | ORAL | Status: DC | PRN

## 2020-07-16 MED ORDER — HYDROCORTISONE NA SUCCINATE PF 100 MG IJ SOLR
50.0000 mg | Freq: Four times a day (QID) | INTRAMUSCULAR | Status: DC
  Administered 2020-07-16: 50 mg via INTRAVENOUS
  Filled 2020-07-16: qty 2

## 2020-07-16 MED ORDER — DEXTROSE 5 % IV SOLN: INTRAVENOUS | Status: DC

## 2020-07-16 MED ORDER — SODIUM ZIRCONIUM CYCLOSILICATE 10 G PO PACK
10.0000 g | PACK | Freq: Once | ORAL | Status: AC
  Administered 2020-07-16: 10 g
  Filled 2020-07-16: qty 1

## 2020-07-16 MED ORDER — EPINEPHRINE HCL 5 MG/250ML IV SOLN IN NS
0.5000 ug/min | INTRAVENOUS | Status: DC
  Administered 2020-07-16: 10:00:00 1 ug/min via INTRAVENOUS
  Administered 2020-07-16: 14:00:00 20 ug/min via INTRAVENOUS
  Filled 2020-07-16 (×2): qty 250

## 2020-07-16 MED ORDER — MORPHINE 100MG IN NS 100ML (1MG/ML) PREMIX INFUSION: 0.0000 mg/h | INTRAVENOUS | Status: DC

## 2020-07-16 MED ORDER — ACETAMINOPHEN 650 MG RE SUPP: 650.0000 mg | Freq: Four times a day (QID) | RECTAL | Status: DC | PRN

## 2020-07-16 MED ORDER — SODIUM BICARBONATE 8.4 % IV SOLN: 50.0000 meq | Freq: Once | INTRAVENOUS | Status: AC

## 2020-07-16 MED ORDER — MORPHINE SULFATE (PF) 2 MG/ML IV SOLN: 2.0000 mg | INTRAVENOUS | Status: DC | PRN

## 2020-07-17 DIAGNOSIS — J9611 Chronic respiratory failure with hypoxia: Secondary | ICD-10-CM

## 2020-08-05 MED FILL — Chlorhexidine Gluconate Soln 0.12%: OROMUCOSAL | Qty: 15 | Status: AC

## 2020-08-05 MED FILL — Pantoprazole Sodium EC Tab 40 MG (Base Equiv): ORAL | Qty: 40 | Status: AC

## 2020-08-05 MED FILL — Furosemide Inj 10 MG/ML: INTRAMUSCULAR | Qty: 6 | Status: AC

## 2020-08-05 MED FILL — Docusate Sodium Cap 100 MG: ORAL | Qty: 100 | Status: AC

## 2020-08-05 MED FILL — Sodium Zirconium Cyclosilicate For Susp Packet 10 GM: ORAL | Qty: 10 | Status: AC

## 2020-08-07 NOTE — Progress Notes (Signed)
Daughter called to inform me that family had decided they wanted to withdraw care and start comfort measures. We are allowing 8 visitors, 2 at a time, for 15 min each before any comfort measures are taken so family can have time to say their goodbyes.

## 2020-08-07 NOTE — Progress Notes (Addendum)
Discussed again with daughter who had questions about proning Explained to the daughter that At present he is on multiple pressors and acidotic and I feel that he will go into cardiac arrest if he were to attempt proning and would not recommend it.  Family later contacted the nurse and asked to transition to comfort care.  Order for withdrawal placed.  [name] MD [facility] Please see [email or web address] for pager details.  07/12/2020, 5:05 PM

## 2020-08-07 NOTE — Progress Notes (Signed)
CRITICAL VALUE ALERT  Critical Value:  Lactic 10.6  Date & Time Notied:  07/23/2020 @ 0655  Provider Notified: Pola Corn MD  Orders Received/Actions taken: [facility] notified

## 2020-08-07 NOTE — Progress Notes (Signed)
CRITICAL VALUE ALERT  Critical Value:  7.09/70.6/45/22  Date & Time Notied:  2020-08-13 @ 0529  Provider Notified: Pola Corn MD  Orders Received/Actions taken: Pola Corn made aware

## 2020-08-07 NOTE — Progress Notes (Signed)
Patient ID: Gregory [name], male   DOB: 1951-12-05, 69 y.o.   MRN: [id number]  This NP continues review of medical records, discussed with team (Dr Isaiah Serge and bedside RN)  viewed the patient from ICU window before talking with daughter by phone as f/u for for palliative medicine needs and emotional support.  Patient currently intubated, CRRT in progress, requiring pressors for blood pressure support, patient remains hypothermic. Continued physical decline within context of maximum medical intervention.     Poor prognosis.  I spoke with daughter/Janessa for continued education/conversation regarding current medical situation.  I shared with family my concern regarding his guarded prognosis.  Education offered on the limitations of medical interventions to prolong life when the body fails to thrive.  Education offered on signs if transition at EOL, I expressed that I believe time was limited for [name].  Family understand the seriousness of the medical situation and the medical team's understanding of poor prognosis however at this time they are open to all offered and available medical interventions to prolong life.  Daughter has plethora of questions regarding Covid-19 treatments,  specific to proning. Contacted Dr Isaiah Serge and he will reach out to daughter   I assured family that the medical team is treating patient with  maximum medical interventions.  [name] tells me they are a family of faith and they trust in power of God and prayer.  Emotional support offered.  Questions and concerns addressed   Discussed with Dr Isaiah Serge via secure chat and bedside RN/Lindsey  Total time spent on the unit was 15 minutes     PMT will continue to support holistically   Greater than 50% of the time was spent in counseling and coordination of care  Lorinda Creed NP  Palliative Medicine Team Team Phone # [phone] Pager [phone]

## 2020-08-07 NOTE — Progress Notes (Signed)
Time of death 46. Family at the bedside with RN at time of death. 2 RNs verified time of death and MD notified.

## 2020-08-07 NOTE — Progress Notes (Signed)
eLink Physician-Brief Progress Note Patient Name: Lawarence Meek DOB: 1951/11/27 MRN: [id number]   Date of Service  07/20/2020  HPI/Events of Note  Lactic Acid = 10.6 - Hgb = 7.5 and CVP = 10.   eICU Interventions  Plan: 1. COOX now.      Intervention Category Major Interventions: Acid-Base disturbance - evaluation and management  Ishaq Maffei Eugene 08/03/2020, 6:59 AM

## 2020-08-07 NOTE — Progress Notes (Addendum)
NAME:  Gregory [name], MRN:  [id number], DOB:  [birth year], LOS: 13 ADMISSION DATE:  2020/07/29, CONSULTATION DATE:  07/09/2020 REFERRING MD:  Elgergawy, CHIEF COMPLAINT:  SOB, hypoxemia   Brief History   69 year old male with history of diabetes mellitus, hypertension, gout and CVA without focal deficits presented on 7/28 with hypoxia on non-rebreather. Code sepsis initiated s/p fluid resuscitation. Admitted to ICU for acute hypoxic respiratory failure 2/2 COVID pneumonia. Course c/b renal failure on dialysis.  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 No on neo-, vaso-, levo and amiodarone drips Started sodium bicarb drip  Objective   Blood pressure (!) 118/45, pulse (!) 57, temperature (!) 96.8 F (36 C), temperature source Core (Comment), resp. rate (!) 26, height 5\' 7"  (1.702 m), weight 108.3 kg, SpO2 90 %. CVP:  [10 mmHg] 10 mmHg  Vent Mode: PRVC FiO2 (%):  [100 %] 100 % Set Rate:  [35 bmp] 35 bmp Vt Set:  [400 mL-520 mL] 520 mL PEEP:  [18 cmH20] 18 cmH20 Plateau Pressure:  [35 cmH20-48 cmH20] 44 cmH20   Intake/Output Summary (Last 24 hours) at 08/06/2020 0846 Last data filed at 08/01/2020 0800 Gross per 24 hour  Intake 5318.16 ml  Output 1896 ml  Net 3422.16 ml   Filed Weights   07/14/20 0401 07/15/20 0500  08/04/2020 0500  Weight: 100.5 kg 109.8 kg 108.3 kg    Examination: Gen:      No acute distress HEENT:  EOMI, sclera anicteric Neck:     No masses; no thyromegaly, ETT Lungs:    Clear to auscultation bilaterally; normal respiratory effort CV:         Regular rate and rhythm; no murmurs Abd:      + bowel sounds; soft, non-tender; no palpable masses, no distension Ext:    No edema; adequate peripheral perfusion Skin:      Warm and dry; no rash Neuro: Sedated, unresponsive  Labs/imaging reviewed Significant for pH 7.1 2/70/42 Potassium 5.2, hemoglobin 7.1, lactic acid 10.6 Chest x-ray-bilateral widespread airspace disease. Labs/imaging reviewed  Resolved Hospital Problem list    Assessment & Plan:  Acute hypoxic and hypercarbic respiratory failure secondary to moderate ARDS in setting of COVID 19 pneumonia:  Worsening ARDS, multiorgan failure with lactic acidosis Maxed out on vent support.  Too unstable to prone  Septic shock Continue pressors  Leukocytosis, fever Continue empiric Vanco, cefepime  Afib RVR: Currently normal sinus rhythm Amiodarone drip  Acute toxic metabolic encephalopathy Fentanyl drip  Acute on chronic renal insufficiency with renal failure Secondary hyperparathyroidism  Continue CVVH  Acute LLE DVT Able to Doppler LLE pulse AM 8/7 . - LLE arterial 10/7 with no evidence of occlusive clot - Heparin on hold due to low platelets.   Hyperglycemia in setting of diabetes mellitus: Elevated in setting of steroids and starting TFs. Insulin gtt evening  8/7 now off. - Lantus 10 u qhs, SSI, tube feeding coverage  Goals of care Discussed with daughter who was able to come in from [place] and visit.  Discussed that prognosis is extremely poor and he will likely not survive Family is aware and are requesting full code. Discussed again on 8/10.  Best practice:  Diet: tube feeds  Pain/Anxiety/Delirium protocol (if indicated): per protocol VAP protocol (if  indicated): per protocol DVT prophylaxis: SCDs, Heparin on hold GI prophylaxis: Protonix Glucose control: as above Mobility: Bed rest  Code Status: Full Family Communication: as above Disposition: ICU  Critical care time: 45 minutes   The patient is critically ill with multiple organ system failure and requires high complexity decision making for assessment and support, frequent evaluation and titration of therapies, advanced monitoring, review of radiographic studies and interpretation of complex data.   Critical Care Time devoted to patient care services, exclusive of separately billable procedures, described in this note is 35 minutes.   [name] MD [facility] Please see [email or web address] for pager details.  07/28/2020, 8:46 AM

## 2020-08-07 NOTE — Plan of Care (Signed)
  Interdisciplinary Goals of Care Family Meeting   Date carried out:: 07/26/2020  Location of the meeting: Bedside  Member's involved: Physician, Bedside Registered Nurse and Family Member or next of kin  Durable Power of Attorney or acting medical decision maker: Daughter who was able to visit to bedside  Discussion: We discussed goals of care for [name] .  The rest of his family were able to camera into the room.  We discussed his worsening clinical status, multiorgan failure and poor prognosis for recovery.  He is currently maxed out on multiple pressors including epinephrine and continues to deteriorate.  Family is struggling to make a decision on withdrawal.  They have however agreed for DNR.  Continue all other measures   Code status: Limited Code or DNR with short term, Mechanical ventilation, Cental IV access and IV vasoactive drugs  Disposition: Continue current acute care  Time spent for the meeting: 35 minutes  Gregory [name] 07/27/2020, 12:09 PM

## 2020-08-07 NOTE — Progress Notes (Signed)
eLink Physician-Brief Progress Note Patient Name: Gregory [name] DOB: Aug 16, 1951 MRN: [id number]   Date of Service  07/29/2020  HPI/Events of Note  ABG on 100%/PRVC 35/TV 520/P 18 = 7.094/70.6/45.  eICU Interventions  Plan: 1. NaHCO3 100 meq IV now. 2. Repeat ABG at 8 AM. 3.  Lokelma 10 gm per tube now 4. Repeat BMP at 12 noon.      Intervention Category Major Interventions: Acid-Base disturbance - evaluation and management;Respiratory failure - evaluation and management  Cledith Abdou Dennard Nip 07/17/2020, 5:35 AM

## 2020-08-07 NOTE — Progress Notes (Signed)
CRITICAL VALUE ALERT  Critical Value:  7.074/70/47/20.9  Date & Time Notied:  08/05/2020 @ 0207  Provider Notified: Pola Corn MD  Orders Received/Actions taken: MD made aware of critical

## 2020-08-07 NOTE — Progress Notes (Signed)
eLink Physician-Brief Progress Note Patient Name: Gregory [name] DOB: 17-Jul-1951 MRN: [id number]   Date of Service  07/28/2020  HPI/Events of Note  ABG on 100%/PVC 35/TV 520/P 18 = 7.074/70.0/47. No room to increase PRVC rate or TV.   eICU Interventions  Plan: 1. NaHCO3 100 meq IV now. 2. Increase NaHCO3 IV infusion to 125 mL/hour. 3. Repeat ABG at 5 AM.      Intervention Category Major Interventions: Acid-Base disturbance - evaluation and management;Respiratory failure - evaluation and management  Neco Kling Eugene 07/25/2020, 2:18 AM

## 2020-08-07 NOTE — Progress Notes (Signed)
[facility] Progress Note   69 year old BM- appears to have CKD at baseline but unclear to what degree- initial urine with protein- 3 grams - normal sized kidneys c/w medical renal disease- At best crt has been 2.6. 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 Oliguric and BUN of over 100- Issues with hyperkalemia. Family desires full scope of care.didhemodialysis 8/7 and with  decompensation -> CRRT 8/8.  Seen on CRRT through left fem cath Pre/post/dialysate 4/4/2 Unable to UF bec of high vasopressor  Requirements.  Severely acidotic as well.  Certainly support palliative measures as no progress with CRRT supportive care and not even able to UF despite marked volume overload bec of the tenuous pressures.   2. Hyperkalemia- correct with CRRT- 4 K pre and post but 2 k dialysate  3. Hypertension/volume -Min UF with HD due to low BP but Fio2 is going up- Will try some UF with CRRT  4. Anemia - had not been issuebut dropping- No meds yet- Will give dose of darbe today  5. COVID PNA- Vent/steriods- per CCM   Subjective:   Levo, Neo, Vaso, Epi  FIO2 100% P18 and sats still not good (85%)   Objective:   BP (!) 88/55   Pulse (!) 57   Temp (!) 96.4 F (35.8 C)   Resp (!) 23   Ht 5' 7" (1.702 m)   Wt 108.3 kg   SpO2 (!) 88%   BMI 37.39 kg/m   Intake/Output Summary (Last 24 hours) at 15-Aug-2020 1204 Last data filed at August 15, 2020 1200 Gross per 24 hour  Intake 6193.72 ml  Output 1820 ml  Net 4373.72 ml   Weight change: -1.5 kg  Physical Exam: GEN: Intubated NECK: Supple, no thyromegaly LUNGS: Ventilated CV: bradycardic ABD: SNDNT  EXT: No lower  extremity edema GU: Foley cath to gravity Access: lt fem vasc cath  Imaging: [facility] Chest Port 1 View  Result Date: 08/15/2020 CLINICAL DATA:  Hypoxia.  Reported COVID-19 pneumonia EXAM: PORTABLE CHEST 1 VIEW COMPARISON:  July 14, 2020 FINDINGS: Endotracheal tube tip is 3.3 cm above the carina. Central catheter tip is in the superior vena cava. Feeding tube tip is below the diaphragm. No pneumothorax. There is widespread airspace opacity throughout the lungs bilaterally, similar to 2 days prior. Heart size and pulmonary vascularity are normal. No adenopathy. No bone lesions. IMPRESSION: Tube and catheter positions as described without pneumothorax. Widespread airspace opacity bilaterally, likely due to multifocal pneumonia. A degree of underlying ARDS cannot be excluded. Stable cardiac silhouette. Electronically Signed   By: Lowella Grip III M.D.   On: 08-15-20 07:56   VAS [place] LOWER EXTREMITY ARTERIAL DUPLEX  Result Date: 07/14/2020 LOWER EXTREMITY ARTERIAL DUPLEX STUDY Indications: Unable to palpate/Doppler pulses in left foot. Other Factors: Covid-19, ventilation, continuous dialysis.  Current ABI: N/A Limitations: Line/bandages in groin Comparison Study: No prior study Performing Technologist: Sharion Dove RVS  Examination Guidelines: A complete evaluation includes B-mode imaging, spectral Doppler, color Doppler, and power Doppler as needed of all accessible portions of each vessel. Bilateral testing is considered an integral part of a complete examination. Limited examinations for reoccurring indications may be performed as noted.  +----------+--------+-----+--------+---------+---------------------------------+ LEFT  PSV cm/sRatioStenosisWaveform Comments                          +----------+--------+-----+--------+---------+---------------------------------+ CFA Prox                                Not visualized secondary to                                                line/bandages                     +----------+--------+-----+--------+---------+---------------------------------+ SFA Prox  125                  triphasic                                  +----------+--------+-----+--------+---------+---------------------------------+ SFA Mid   119                  triphasic                                  +----------+--------+-----+--------+---------+---------------------------------+ SFA Distal113                  triphasic                                  +----------+--------+-----+--------+---------+---------------------------------+ POP Prox  99                   triphasic                                  +----------+--------+-----+--------+---------+---------------------------------+ POP Distal87                   triphasic                                  +----------+--------+-----+--------+---------+---------------------------------+ ATA Prox  34                   triphasic                                  +----------+--------+-----+--------+---------+---------------------------------+ ATA Mid   94                   triphasic                                  +----------+--------+-----+--------+---------+---------------------------------+ ATA Distal65                   triphasic                                  +----------+--------+-----+--------+---------+---------------------------------+ PTA Prox  182  triphasic                                  +----------+--------+-----+--------+---------+---------------------------------+ PTA Mid   129                  triphasic                                  +----------+--------+-----+--------+---------+---------------------------------+ PTA Distal99                   triphasic                                  +----------+--------+-----+--------+---------+---------------------------------+  Summary: Left: Normal examination. No evidence of  arterial occlusive disease.  See table(s) above for measurements and observations. Electronically signed by Harold Barban MD on 07/14/2020 at 4:56:13 PM.    Final     Labs: BMET Recent Labs  Lab 07/12/20 1208 07/12/20 1208 07/12/20 1822 07/13/20 0248 07/13/20 0250 07/13/20 0250 07/13/20 2045 07/14/20 0400 07/14/20 0071 07/14/20 1559 07/14/20 1559 07/15/20 0455 07/15/20 0455 07/15/20 1146 07/15/20 1608 07/15/20 1951 07/26/2020 0156 07/26/2020 0303 2020-07-26 0516 2020/07/26 0745  NA 144   < > 144   < > 143   < >  --  141   < > 137   < > 136   < > 135 136 134* 136 139 136 138  K 5.5*   < > 5.7*   < > 5.7*   < >  --  5.2*   < > 5.5*   < > 4.9   < > 4.8 5.1 5.1 5.1 5.2* 5.2* 5.2*  CL 112*   < > 113*  --  109  --   --  105  --  103  --  102  --   --  101  --   --  101  --   --   CO2 20*   < > 21*  --  20*  --   --  23  --  22  --  22  --   --  21*  --   --  22  --   --   GLUCOSE 241*   < > 196*  --  346*  --   --  218*  --  223*  --  241*  --   --  192*  --   --  110*  --   --   BUN 114*   < > 121*  --  133*  --   --  108*  --  97*  --  73*  --   --  59*  --   --  52*  --   --   CREATININE 4.72*   < > 5.15*  --  5.58*  --   --  5.17*  --  4.78*  --  3.61*  --   --  2.97*  --   --  2.74*  --   --   CALCIUM 8.1*   < > 7.6*  --  7.7*  --   --  7.9*  --  7.4*  --  7.4*  --   --  7.4*  --   --  7.0*  --   --   PHOS  7.3*  --  8.3*  --  8.8*  --  7.2*  --   --  6.5*  --   --   --   --  6.2*  --   --  6.9*  --   --    < > = values in this interval not displayed.   CBC Recent Labs  Lab 07/14/20 0400 07/14/20 0832 07/14/20 0933 07/14/20 1026 07/15/20 0455 07/15/20 1146 07-29-20 0156 07/29/2020 0303 July 29, 2020 0516 07/29/20 0745  WBC 6.3  --  11.4*  --  15.5*  --   --  11.7*  --   --   HGB 9.2*   < > 9.4*   < > 8.7*   < > 7.1* 7.5* 7.5* 7.1*  7.4*  HCT 30.3*   < > 30.3*   < > 27.7*   < > 21.0* 24.4* 22.0* 21.0*  24.1*  MCV 92.7  --  95.0  --  92.0  --   --  93.1  --   --   PLT 62*  --  82*   --  48*  --   --  40*  --   --    < > = values in this interval not displayed.    Medications:    . amiodarone  150 mg Intravenous Once  . chlorhexidine gluconate (MEDLINE KIT)  15 mL Mouth Rinse BID  . Chlorhexidine Gluconate Cloth  6 each Topical Q0600  . docusate  100 mg Per Tube BID  . feeding supplement (PROSource TF)  45 mL Per Tube TID  . hydrocortisone sod succinate (SOLU-CORTEF) inj  50 mg Intravenous Q6H  . insulin aspart  0-9 Units Subcutaneous Q4H  . insulin aspart  2 Units Subcutaneous Q4H  . insulin detemir  30 Units Subcutaneous BID  . mouth rinse  15 mL Mouth Rinse 10 times per day  . multivitamin with minerals  1 tablet Per Tube Daily  . pantoprazole sodium  40 mg Per Tube Daily  . polyethylene glycol  17 g Per Tube Daily  . QUEtiapine  50 mg Per Tube BID  . sodium chloride flush  10-40 mL Intracatheter Q12H  . sodium chloride flush  10-40 mL Intracatheter Q12H  . sodium chloride flush  3 mL Intravenous Q12H      Otelia Santee, MD 2020-07-29, 12:04 PM

## 2020-08-07 NOTE — Progress Notes (Signed)
RT note-Patient expired, ventilator discontinued at this time.

## 2020-08-07 NOTE — Discharge Summary (Signed)
Physician Death Summary  Patient ID: Gregory [name] MRN: [id number] DOB/AGE: [birth year] 69 y.o.  Admit date: 06/11/2020 Discharge date: 08-02-20  Admission Diagnoses:  COVID 19 pneumonia  Discharge Diagnoses:  Active Problems:   Sepsis (HCC)   Hypoxia   Shortness of breath   Acute respiratory disease due to COVID-19 virus   Goals of care, counseling/discussion   Palliative care by specialist   Acute renal failure (HCC)   ARDS (adult respiratory distress syndrome) (HCC)   Encephalopathy   Ventilator dependent (HCC)   Pressure injury of skin   Chronic respiratory failure with hypoxia [facility])   Discharged Condition: Deceased  Hospital Course:  69 year old male with history of diabetes mellitus, hypertension, gout and CVA without focal deficits presented on 7/28 with hypoxia on non-rebreather. Code sepsis initiated s/p fluid resuscitation. Admitted to ICU for acute hypoxic respiratory failure 2/2 COVID pneumonia. Course c/b renal failure requiring CRRT on 8/7 He continued to deteriorate with worsening shock, lactic acidosis, respiratory failure. Multiple discussions with palliative care and family.  Family was able to visit patient at bedside They requested DNR and after further discussions asked for transition to comfort measures.  He passed away in the evening of 8/10 with family at bedside  [name] MD [facility] Please see [email or web address] for pager details.  07/18/2020, 11:50 AM

## 2020-08-07 NOTE — Progress Notes (Signed)
This chaplain responded to PMT consult for spiritual care.  The chaplain attempted to contact Pt. daughter-Janessa [phone]. The chaplain left a voice mail. The chaplain introduced the opportunity for family  spiritual care.  The chaplain will attempt a second contact.

## 2020-08-07 NOTE — Progress Notes (Signed)
Assisted tele visit to patient with family member.  Magnolia Mattila P, RN  

## 2020-08-07 NOTE — Progress Notes (Signed)
Patient daughter called wanted to know protocal for proning I tried to educate. I told her that I would have the Critical care medicine doctor call her. I have messaged Dr. Isaiah Serge to call daughter to help explain the patient condition.

## 2020-08-07 DEATH — deceased

## 2020-12-24 IMAGING — DX DG CHEST 1V PORT
1 series · 1 of 1 positions shown · non-contrast
Comparison: July 11, 2020

CLINICAL DATA: Mental status change.

EXAM:
PORTABLE CHEST 1 VIEW

[chest ap]
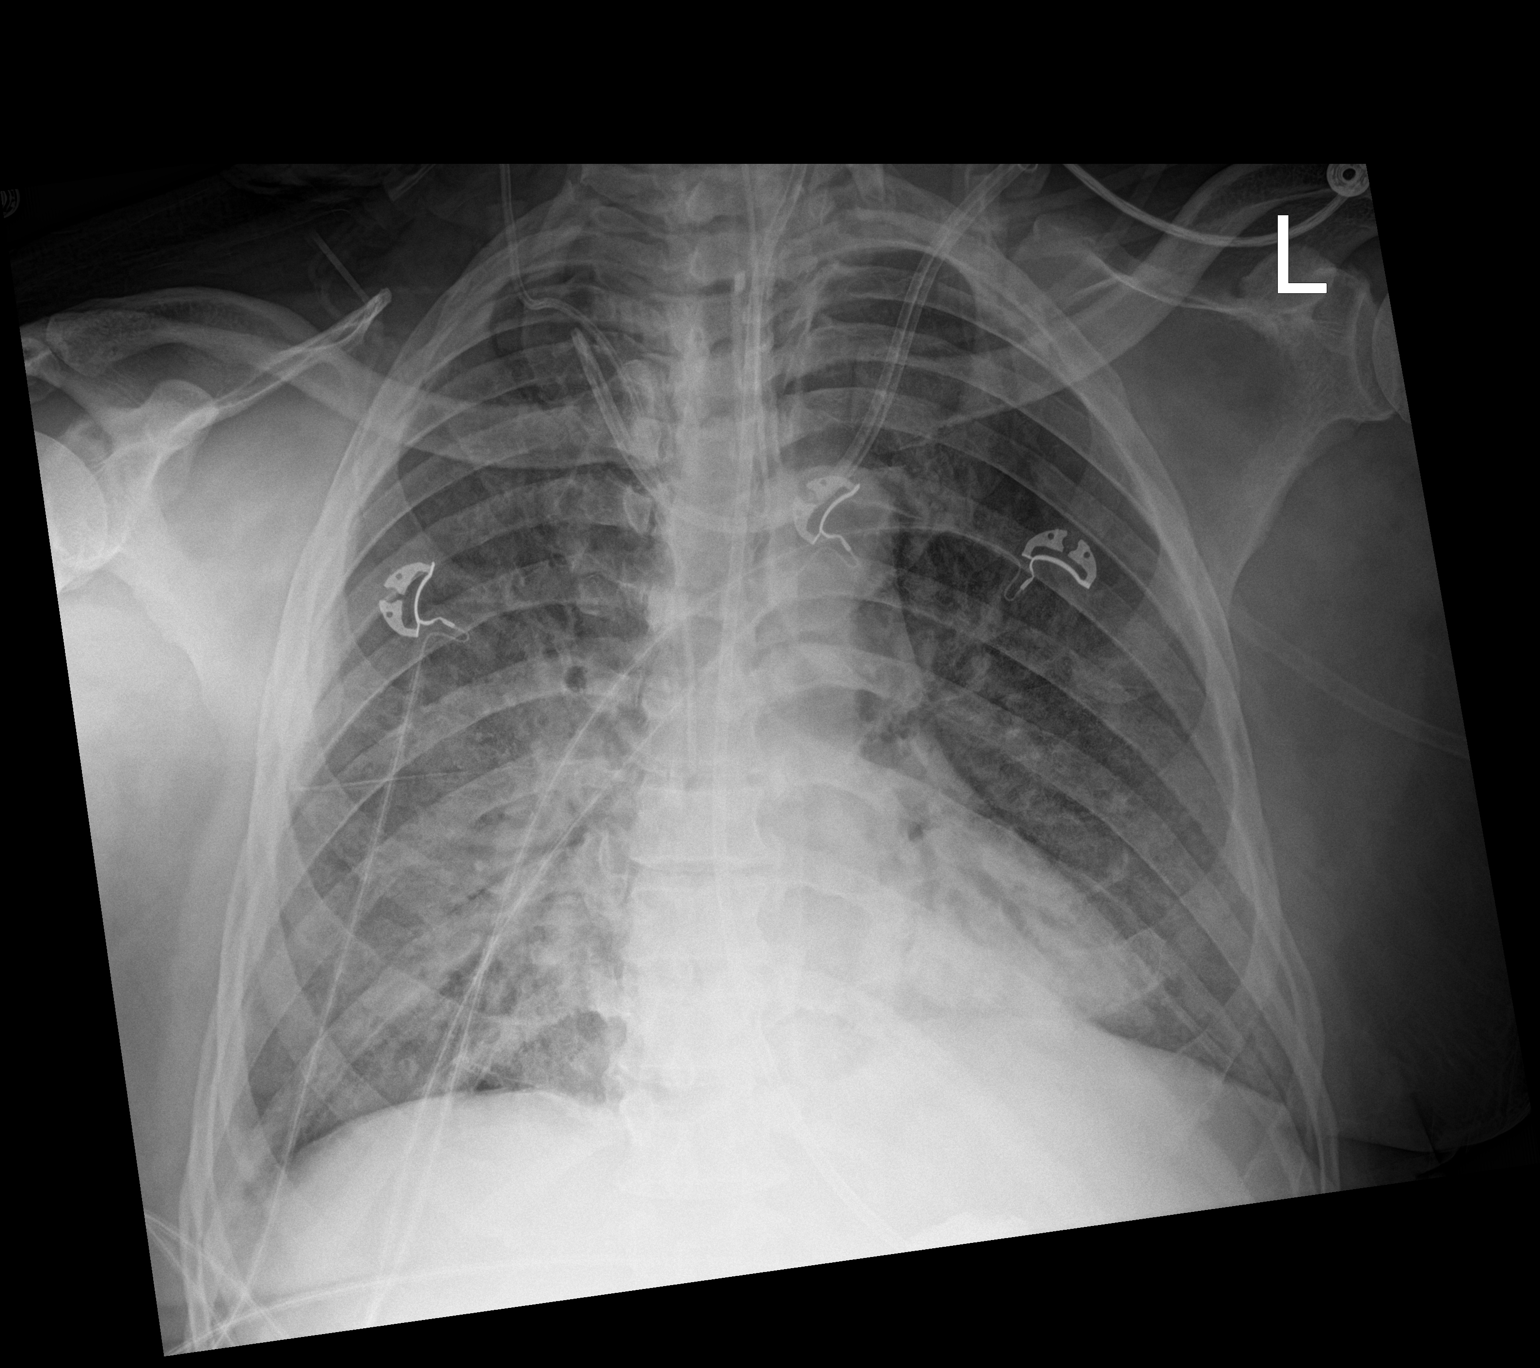

[1 of 1 positions shown; findings below may reference images not displayed]

FINDINGS: There are persistent diffuse bilateral hazy airspace opacities which
have not significantly changed since the prior study. The
right-sided central venous catheter is unchanged in positioning.
There is a new left-sided dialysis catheter that courses across the
patient's midline and likely terminates in the right internal
jugular vein. The enteric tube terminates below the left
hemidiaphragm. The endotracheal tube terminates above the carina.
There is no pneumothorax. There may be small bilateral pleural
effusions.
IMPRESSION: 1. New left-sided dialysis catheter that courses across the
patient's midline and likely terminates in the right internal
jugular vein. Repositioning is recommended.
2. Remaining lines and tubes as above.
3. No significant interval change in appearance of the lungs
bilaterally.

These results will be called to the ordering clinician or
representative by the Radiologist Assistant, and communication
documented in the PACS or [REDACTED].

## 2020-12-26 IMAGING — DX DG CHEST 1V PORT
1 series · 1 of 1 positions shown · non-contrast
Comparison: Chest radiograph from earlier today.

CLINICAL DATA: [id number] pneumonia

EXAM:
PORTABLE CHEST 1 VIEW

[chest ap]
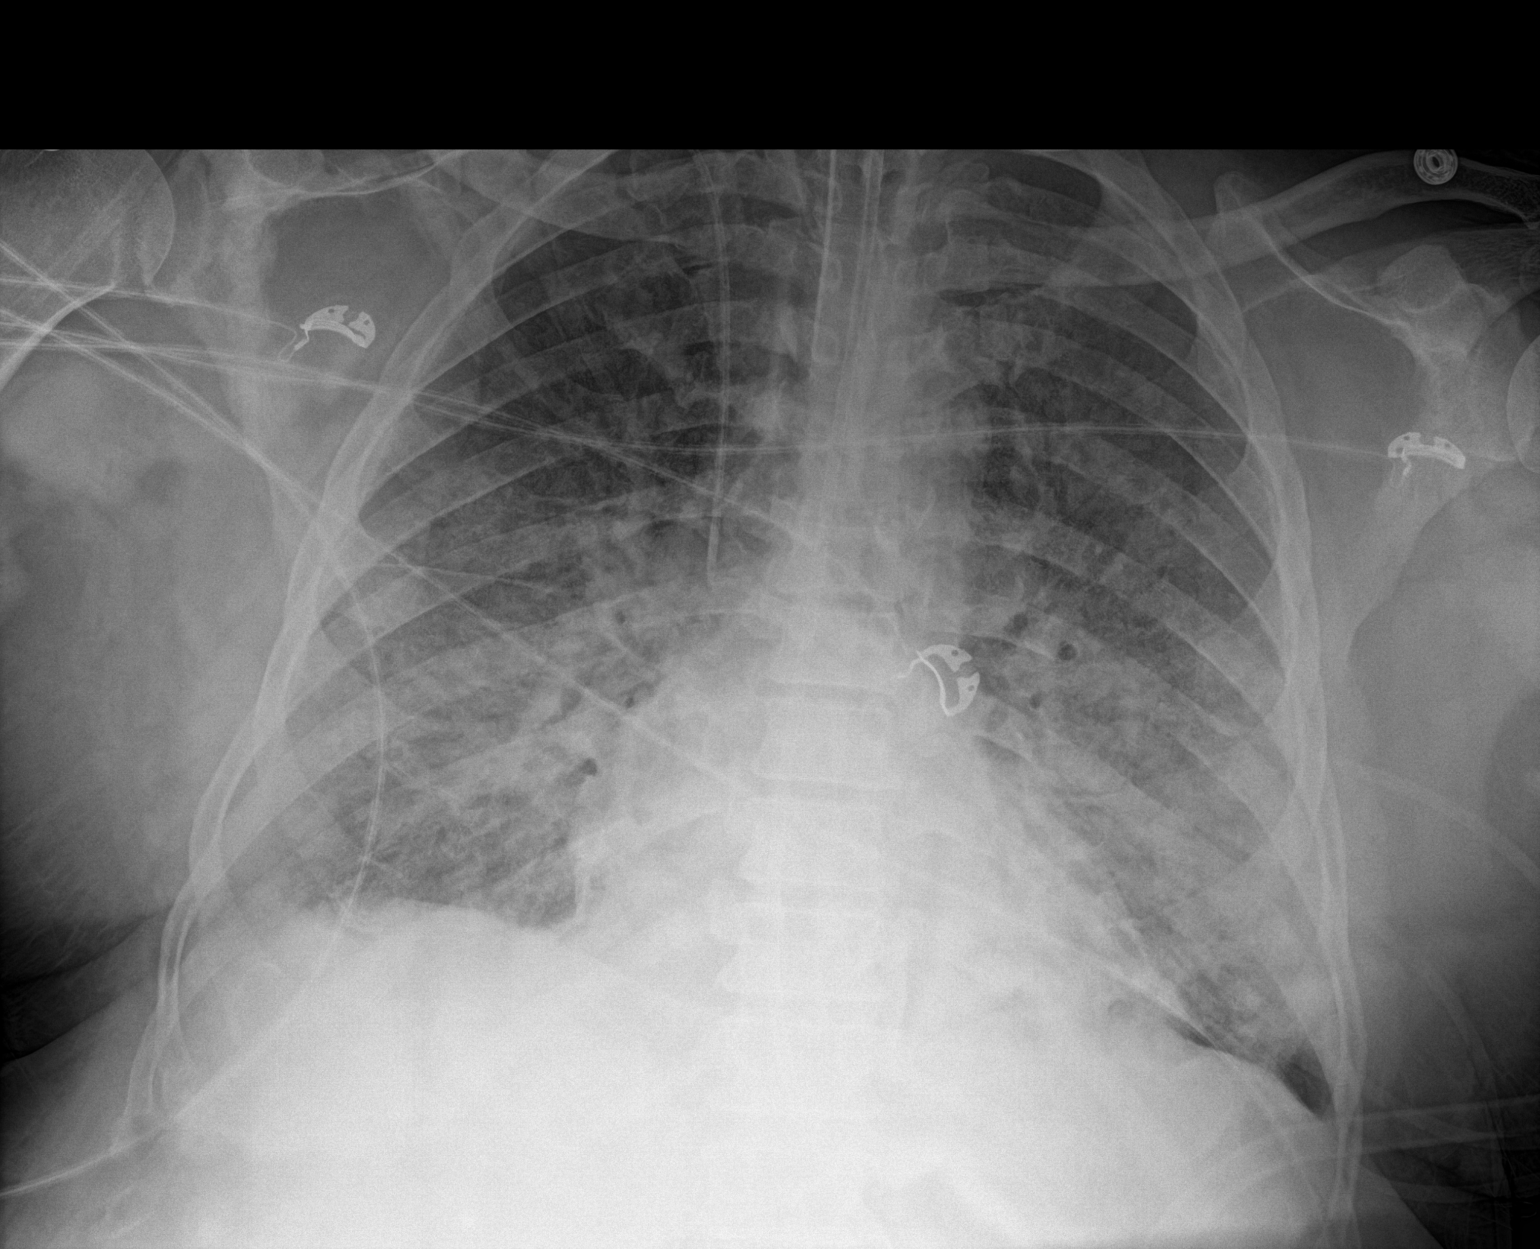

[1 of 1 positions shown; findings below may reference images not displayed]

FINDINGS: Endotracheal tube tip is 3.2 cm above the carina. Enteric tube
enters stomach with the tip not seen on this image. Right internal
jugular central venous catheter terminates in middle third of the
SVC. Stable cardiomediastinal silhouette with normal heart size. No
pneumothorax. No pleural effusion. Extensive patchy hazy opacities
throughout both lungs, unchanged.
IMPRESSION: 1. Well-positioned support structures. No pneumothorax.
2. Stable extensive patchy hazy opacities throughout both lungs
compatible with [id number] pneumonia.
